# Patient Record
Sex: Female | Born: 1935 | Race: White | Hispanic: No | Smoking: Never smoker
Health system: Southern US, Community
[De-identification: ages and names within clinical notes are randomized; demographics above are authoritative.]

## PROBLEM LIST (undated history)

## (undated) DIAGNOSIS — C801 Malignant (primary) neoplasm, unspecified: Secondary | ICD-10-CM

## (undated) DIAGNOSIS — I10 Essential (primary) hypertension: Secondary | ICD-10-CM

## (undated) DIAGNOSIS — E079 Disorder of thyroid, unspecified: Secondary | ICD-10-CM

## (undated) DIAGNOSIS — M1990 Unspecified osteoarthritis, unspecified site: Secondary | ICD-10-CM

## (undated) DIAGNOSIS — N289 Disorder of kidney and ureter, unspecified: Secondary | ICD-10-CM

## (undated) DIAGNOSIS — M109 Gout, unspecified: Secondary | ICD-10-CM

## (undated) HISTORY — PX: ABDOMINAL HYSTERECTOMY: SHX81

## (undated) HISTORY — PX: THYROIDECTOMY: SHX17

## (undated) HISTORY — PX: CHOLECYSTECTOMY: SHX55

---

## 2002-11-18 ENCOUNTER — Ambulatory Visit (HOSPITAL_COMMUNITY): Admission: RE | Admit: 2002-11-18 | Discharge: 2002-11-18 | Payer: Self-pay | Admitting: Family Medicine

## 2009-02-24 ENCOUNTER — Emergency Department (HOSPITAL_COMMUNITY): Admission: EM | Admit: 2009-02-24 | Discharge: 2009-02-24 | Payer: Self-pay | Admitting: Emergency Medicine

## 2009-03-27 ENCOUNTER — Emergency Department (HOSPITAL_COMMUNITY): Admission: EM | Admit: 2009-03-27 | Discharge: 2009-03-27 | Payer: Self-pay | Admitting: Emergency Medicine

## 2009-04-20 ENCOUNTER — Emergency Department (HOSPITAL_COMMUNITY)
Admission: EM | Admit: 2009-04-20 | Discharge: 2009-04-20 | Payer: Self-pay | Source: Home / Self Care | Admitting: Emergency Medicine

## 2009-05-07 ENCOUNTER — Ambulatory Visit (HOSPITAL_COMMUNITY): Admission: RE | Admit: 2009-05-07 | Discharge: 2009-05-07 | Payer: Self-pay | Admitting: Family Medicine

## 2009-07-16 ENCOUNTER — Encounter (INDEPENDENT_AMBULATORY_CARE_PROVIDER_SITE_OTHER): Payer: Self-pay | Admitting: General Surgery

## 2009-07-16 ENCOUNTER — Ambulatory Visit (HOSPITAL_COMMUNITY)
Admission: RE | Admit: 2009-07-16 | Discharge: 2009-07-17 | Payer: Self-pay | Source: Home / Self Care | Admitting: General Surgery

## 2010-01-23 ENCOUNTER — Encounter: Payer: Self-pay | Admitting: General Surgery

## 2010-03-19 LAB — POCT I-STAT 4, (NA,K, GLUC, HGB,HCT)
Glucose, Bld: 133 mg/dL — ABNORMAL HIGH (ref 70–99)
HCT: 36 % (ref 36.0–46.0)
Hemoglobin: 12.2 g/dL (ref 12.0–15.0)
Potassium: 3.6 mEq/L (ref 3.5–5.1)
Sodium: 141 mEq/L (ref 135–145)

## 2010-03-19 LAB — CBC
HCT: 28.8 % — ABNORMAL LOW (ref 36.0–46.0)
Hemoglobin: 9.9 g/dL — ABNORMAL LOW (ref 12.0–15.0)
MCH: 30.5 pg (ref 26.0–34.0)
MCHC: 34.2 g/dL (ref 30.0–36.0)
MCV: 89.4 fL (ref 78.0–100.0)
Platelets: 145 10*3/uL — ABNORMAL LOW (ref 150–400)
RBC: 3.23 MIL/uL — ABNORMAL LOW (ref 3.87–5.11)
RDW: 12.3 % (ref 11.5–15.5)
WBC: 5 10*3/uL (ref 4.0–10.5)

## 2010-03-19 LAB — BASIC METABOLIC PANEL
BUN: 20 mg/dL (ref 6–23)
CO2: 28 mEq/L (ref 19–32)
Calcium: 8.9 mg/dL (ref 8.4–10.5)
Chloride: 108 mEq/L (ref 96–112)
Creatinine, Ser: 1.31 mg/dL — ABNORMAL HIGH (ref 0.4–1.2)
GFR calc Af Amer: 48 mL/min — ABNORMAL LOW (ref >60)
GFR calc non Af Amer: 40 mL/min — ABNORMAL LOW (ref >60)
Glucose, Bld: 108 mg/dL — ABNORMAL HIGH (ref 70–99)
Potassium: 3.4 mEq/L — ABNORMAL LOW (ref 3.5–5.1)
Sodium: 141 mEq/L (ref 135–145)

## 2010-03-19 LAB — DIFFERENTIAL
Basophils Absolute: 0 10*3/uL (ref 0.0–0.1)
Basophils Relative: 1 % (ref 0–1)
Eosinophils Absolute: 0 10*3/uL (ref 0.0–0.7)
Eosinophils Relative: 1 % (ref 0–5)
Lymphocytes Relative: 34 % (ref 12–46)
Lymphs Abs: 1.7 10*3/uL (ref 0.7–4.0)
Monocytes Absolute: 0.5 10*3/uL (ref 0.1–1.0)
Monocytes Relative: 10 % (ref 3–12)
Neutro Abs: 2.8 10*3/uL (ref 1.7–7.7)
Neutrophils Relative %: 55 % (ref 43–77)

## 2010-03-19 LAB — HEPATIC FUNCTION PANEL
ALT: 127 U/L — ABNORMAL HIGH (ref 0–35)
AST: 126 U/L — ABNORMAL HIGH (ref 0–37)
Albumin: 3.2 g/dL — ABNORMAL LOW (ref 3.5–5.2)
Alkaline Phosphatase: 116 U/L (ref 39–117)
Bilirubin, Direct: <0.1 mg/dL (ref 0.0–0.3)
Total Bilirubin: 0.7 mg/dL (ref 0.3–1.2)
Total Protein: 5.8 g/dL — ABNORMAL LOW (ref 6.0–8.3)

## 2010-03-20 LAB — HEPATIC FUNCTION PANEL
ALT: 13 U/L (ref 0–35)
AST: 17 U/L (ref 0–37)
Albumin: 4 g/dL (ref 3.5–5.2)
Alkaline Phosphatase: 88 U/L (ref 39–117)
Bilirubin, Direct: 0.1 mg/dL (ref 0.0–0.3)
Indirect Bilirubin: 0.4 mg/dL (ref 0.3–0.9)
Total Bilirubin: 0.5 mg/dL (ref 0.3–1.2)
Total Protein: 6.9 g/dL (ref 6.0–8.3)

## 2010-03-20 LAB — LIPASE, BLOOD: Lipase: 26 U/L (ref 11–59)

## 2010-03-20 LAB — DIFFERENTIAL
Basophils Absolute: 0 10*3/uL (ref 0.0–0.1)
Basophils Relative: 1 % (ref 0–1)
Eosinophils Absolute: 0.1 10*3/uL (ref 0.0–0.7)
Eosinophils Relative: 2 % (ref 0–5)
Lymphocytes Relative: 50 % — ABNORMAL HIGH (ref 12–46)
Lymphs Abs: 2.9 10*3/uL (ref 0.7–4.0)
Monocytes Absolute: 0.3 10*3/uL (ref 0.1–1.0)
Monocytes Relative: 6 % (ref 3–12)
Neutro Abs: 2.4 10*3/uL (ref 1.7–7.7)
Neutrophils Relative %: 42 % — ABNORMAL LOW (ref 43–77)

## 2010-03-20 LAB — CBC
HCT: 34.6 % — ABNORMAL LOW (ref 36.0–46.0)
Hemoglobin: 11.8 g/dL — ABNORMAL LOW (ref 12.0–15.0)
MCH: 30.8 pg (ref 26.0–34.0)
MCHC: 34.2 g/dL (ref 30.0–36.0)
MCV: 89.9 fL (ref 78.0–100.0)
Platelets: 180 10*3/uL (ref 150–400)
RBC: 3.85 MIL/uL — ABNORMAL LOW (ref 3.87–5.11)
RDW: 12.4 % (ref 11.5–15.5)
WBC: 5.8 10*3/uL (ref 4.0–10.5)

## 2010-03-20 LAB — BASIC METABOLIC PANEL
BUN: 41 mg/dL — ABNORMAL HIGH (ref 6–23)
CO2: 26 mEq/L (ref 19–32)
Calcium: 9.7 mg/dL (ref 8.4–10.5)
Chloride: 107 mEq/L (ref 96–112)
Creatinine, Ser: 1.68 mg/dL — ABNORMAL HIGH (ref 0.4–1.2)
GFR calc Af Amer: 36 mL/min — ABNORMAL LOW (ref >60)
GFR calc non Af Amer: 30 mL/min — ABNORMAL LOW (ref >60)
Glucose, Bld: 119 mg/dL — ABNORMAL HIGH (ref 70–99)
Potassium: 5.5 mEq/L — ABNORMAL HIGH (ref 3.5–5.1)
Sodium: 141 mEq/L (ref 135–145)

## 2010-03-20 LAB — AMYLASE: Amylase: 32 U/L (ref 0–105)

## 2010-03-20 LAB — SURGICAL PCR SCREEN
MRSA, PCR: NEGATIVE
Staphylococcus aureus: POSITIVE — AB

## 2010-03-20 LAB — TSH: TSH: 0.28 u[IU]/mL — ABNORMAL LOW (ref 0.350–4.500)

## 2010-03-20 LAB — T3: T3, Total: 83.5 ng/dl (ref 80.0–204.0)

## 2010-03-20 LAB — T4: T4, Total: 11.8 ug/dL (ref 5.0–12.5)

## 2010-03-22 LAB — CBC
HCT: 32.1 % — ABNORMAL LOW (ref 36.0–46.0)
Hemoglobin: 11.4 g/dL — ABNORMAL LOW (ref 12.0–15.0)
MCHC: 35.4 g/dL (ref 30.0–36.0)
MCV: 89 fL (ref 78.0–100.0)
Platelets: 203 10*3/uL (ref 150–400)
RBC: 3.61 MIL/uL — ABNORMAL LOW (ref 3.87–5.11)
RDW: 12.7 % (ref 11.5–15.5)
WBC: 8.1 10*3/uL (ref 4.0–10.5)

## 2010-03-22 LAB — DIFFERENTIAL
Basophils Absolute: 0 10*3/uL (ref 0.0–0.1)
Basophils Relative: 0 % (ref 0–1)
Eosinophils Absolute: 0 10*3/uL (ref 0.0–0.7)
Eosinophils Relative: 0 % (ref 0–5)
Lymphocytes Relative: 17 % (ref 12–46)
Lymphs Abs: 1.3 10*3/uL (ref 0.7–4.0)
Monocytes Absolute: 0.4 10*3/uL (ref 0.1–1.0)
Monocytes Relative: 5 % (ref 3–12)
Neutro Abs: 6.4 10*3/uL (ref 1.7–7.7)
Neutrophils Relative %: 78 % — ABNORMAL HIGH (ref 43–77)

## 2010-03-22 LAB — POCT CARDIAC MARKERS
CKMB, poc: <1 ng/mL — ABNORMAL LOW (ref 1.0–8.0)
Myoglobin, poc: 44.2 ng/mL (ref 12–200)
Myoglobin, poc: 97.3 ng/mL (ref 12–200)
Troponin i, poc: <0.05 ng/mL (ref 0.00–0.09)

## 2010-03-22 LAB — COMPREHENSIVE METABOLIC PANEL
ALT: 41 U/L — ABNORMAL HIGH (ref 0–35)
AST: 61 U/L — ABNORMAL HIGH (ref 0–37)
Albumin: 3.5 g/dL (ref 3.5–5.2)
Alkaline Phosphatase: 132 U/L — ABNORMAL HIGH (ref 39–117)
BUN: 38 mg/dL — ABNORMAL HIGH (ref 6–23)
CO2: 27 mEq/L (ref 19–32)
Calcium: 9.2 mg/dL (ref 8.4–10.5)
Chloride: 106 mEq/L (ref 96–112)
Creatinine, Ser: 1.46 mg/dL — ABNORMAL HIGH (ref 0.4–1.2)
GFR calc Af Amer: 43 mL/min — ABNORMAL LOW (ref >60)
GFR calc non Af Amer: 35 mL/min — ABNORMAL LOW (ref >60)
Glucose, Bld: 153 mg/dL — ABNORMAL HIGH (ref 70–99)
Potassium: 3.9 mEq/L (ref 3.5–5.1)
Sodium: 142 mEq/L (ref 135–145)
Total Bilirubin: 0.5 mg/dL (ref 0.3–1.2)
Total Protein: 6.7 g/dL (ref 6.0–8.3)

## 2011-06-30 ENCOUNTER — Ambulatory Visit (HOSPITAL_COMMUNITY)
Admission: RE | Admit: 2011-06-30 | Discharge: 2011-06-30 | Disposition: A | Discharge status: Home / Self Care | Payer: Medicare Other | Source: Ambulatory Visit | Attending: Nephrology | Admitting: Nephrology

## 2011-06-30 DIAGNOSIS — I10 Essential (primary) hypertension: Secondary | ICD-10-CM | POA: Insufficient documentation

## 2011-06-30 DIAGNOSIS — R609 Edema, unspecified: Secondary | ICD-10-CM | POA: Insufficient documentation

## 2011-06-30 DIAGNOSIS — I517 Cardiomegaly: Secondary | ICD-10-CM

## 2011-06-30 NOTE — Progress Notes (Signed)
*  PRELIMINARY RESULTS* Echocardiogram 2D Echocardiogram has been performed.  Amber Terry 06/30/2011, 10:06 AM

## 2011-09-28 ENCOUNTER — Other Ambulatory Visit (HOSPITAL_COMMUNITY): Payer: Self-pay | Admitting: Internal Medicine

## 2011-09-28 DIAGNOSIS — Z139 Encounter for screening, unspecified: Secondary | ICD-10-CM

## 2011-10-02 ENCOUNTER — Ambulatory Visit (HOSPITAL_COMMUNITY)
Admission: RE | Admit: 2011-10-02 | Discharge: 2011-10-02 | Disposition: A | Discharge status: Home / Self Care | Payer: Medicare Other | Source: Ambulatory Visit | Attending: Internal Medicine | Admitting: Internal Medicine

## 2011-10-02 DIAGNOSIS — Z139 Encounter for screening, unspecified: Secondary | ICD-10-CM

## 2011-10-02 DIAGNOSIS — M899 Disorder of bone, unspecified: Secondary | ICD-10-CM | POA: Insufficient documentation

## 2011-10-02 DIAGNOSIS — Z1231 Encounter for screening mammogram for malignant neoplasm of breast: Secondary | ICD-10-CM | POA: Insufficient documentation

## 2011-10-02 DIAGNOSIS — M949 Disorder of cartilage, unspecified: Secondary | ICD-10-CM | POA: Insufficient documentation

## 2011-10-04 ENCOUNTER — Ambulatory Visit: Payer: Medicare Other | Admitting: Cardiology

## 2011-10-24 ENCOUNTER — Ambulatory Visit: Payer: Medicare Other | Admitting: Cardiology

## 2011-10-27 ENCOUNTER — Ambulatory Visit: Payer: Medicare Other | Admitting: Cardiology

## 2014-01-13 ENCOUNTER — Emergency Department (HOSPITAL_COMMUNITY)
Admission: EM | Admit: 2014-01-13 | Discharge: 2014-01-13 | Disposition: A | Discharge status: Home / Self Care | Payer: Medicare Other | Attending: Emergency Medicine | Admitting: Emergency Medicine

## 2014-01-13 ENCOUNTER — Ambulatory Visit (HOSPITAL_COMMUNITY)
Admission: RE | Admit: 2014-01-13 | Discharge: 2014-01-13 | Disposition: A | Discharge status: Home / Self Care | Payer: Medicare Other | Source: Ambulatory Visit | Attending: Family Medicine | Admitting: Family Medicine

## 2014-01-13 ENCOUNTER — Other Ambulatory Visit (HOSPITAL_COMMUNITY): Payer: Self-pay | Admitting: Family Medicine

## 2014-01-13 ENCOUNTER — Encounter (HOSPITAL_COMMUNITY): Payer: Self-pay | Admitting: Cardiology

## 2014-01-13 ENCOUNTER — Emergency Department (HOSPITAL_COMMUNITY): Payer: Medicare Other

## 2014-01-13 DIAGNOSIS — I10 Essential (primary) hypertension: Secondary | ICD-10-CM | POA: Insufficient documentation

## 2014-01-13 DIAGNOSIS — M25571 Pain in right ankle and joints of right foot: Secondary | ICD-10-CM | POA: Diagnosis not present

## 2014-01-13 DIAGNOSIS — Z87448 Personal history of other diseases of urinary system: Secondary | ICD-10-CM | POA: Diagnosis not present

## 2014-01-13 DIAGNOSIS — M25474 Effusion, right foot: Secondary | ICD-10-CM

## 2014-01-13 DIAGNOSIS — Z8639 Personal history of other endocrine, nutritional and metabolic disease: Secondary | ICD-10-CM | POA: Diagnosis not present

## 2014-01-13 DIAGNOSIS — Z859 Personal history of malignant neoplasm, unspecified: Secondary | ICD-10-CM | POA: Insufficient documentation

## 2014-01-13 DIAGNOSIS — M79671 Pain in right foot: Secondary | ICD-10-CM

## 2014-01-13 DIAGNOSIS — R2241 Localized swelling, mass and lump, right lower limb: Secondary | ICD-10-CM | POA: Diagnosis not present

## 2014-01-13 HISTORY — DX: Disorder of thyroid, unspecified: E07.9

## 2014-01-13 HISTORY — DX: Malignant (primary) neoplasm, unspecified: C80.1

## 2014-01-13 HISTORY — DX: Essential (primary) hypertension: I10

## 2014-01-13 HISTORY — DX: Unspecified osteoarthritis, unspecified site: M19.90

## 2014-01-13 HISTORY — DX: Disorder of kidney and ureter, unspecified: N28.9

## 2014-01-13 LAB — CBC WITH DIFFERENTIAL/PLATELET
Basophils Absolute: 0 10*3/uL (ref 0.0–0.1)
Basophils Relative: 0 % (ref 0–1)
EOS ABS: 0 10*3/uL (ref 0.0–0.7)
EOS PCT: 0 % (ref 0–5)
HEMATOCRIT: 40.7 % (ref 36.0–46.0)
HEMOGLOBIN: 13 g/dL (ref 12.0–15.0)
LYMPHS ABS: 3.5 10*3/uL (ref 0.7–4.0)
LYMPHS PCT: 40 % (ref 12–46)
MCH: 29.8 pg (ref 26.0–34.0)
MCHC: 31.9 g/dL (ref 30.0–36.0)
MCV: 93.3 fL (ref 78.0–100.0)
MONOS PCT: 6 % (ref 3–12)
Monocytes Absolute: 0.5 10*3/uL (ref 0.1–1.0)
Neutro Abs: 4.8 10*3/uL (ref 1.7–7.7)
Neutrophils Relative %: 54 % (ref 43–77)
Platelets: 284 10*3/uL (ref 150–400)
RBC: 4.36 MIL/uL (ref 3.87–5.11)
RDW: 12.5 % (ref 11.5–15.5)
WBC: 8.8 10*3/uL (ref 4.0–10.5)

## 2014-01-13 LAB — BASIC METABOLIC PANEL
Anion gap: 11 (ref 5–15)
BUN: 23 mg/dL (ref 6–23)
CALCIUM: 9.3 mg/dL (ref 8.4–10.5)
CO2: 28 mmol/L (ref 19–32)
CREATININE: 1.06 mg/dL (ref 0.50–1.10)
Chloride: 102 mEq/L (ref 96–112)
GFR, EST AFRICAN AMERICAN: 58 mL/min — AB (ref >90)
GFR, EST NON AFRICAN AMERICAN: 50 mL/min — AB (ref >90)
Glucose, Bld: 151 mg/dL — ABNORMAL HIGH (ref 70–99)
POTASSIUM: 3.6 mmol/L (ref 3.5–5.1)
Sodium: 141 mmol/L (ref 135–145)

## 2014-01-13 LAB — SEDIMENTATION RATE: Sed Rate: 87 mm/hr — ABNORMAL HIGH (ref 0–22)

## 2014-01-13 MED ORDER — MORPHINE SULFATE 4 MG/ML IJ SOLN: 4.0000 mg | Freq: Once | INTRAMUSCULAR | Status: DC

## 2014-01-13 MED ORDER — HYDROMORPHONE HCL 1 MG/ML IJ SOLN
0.5000 mg | Freq: Once | INTRAMUSCULAR | Status: AC
  Administered 2014-01-13: 0.5 mg via INTRAVENOUS
  Filled 2014-01-13: qty 1

## 2014-01-13 MED ORDER — HYDROCODONE-ACETAMINOPHEN 5-325 MG PO TABS: 1.0000 | ORAL_TABLET | ORAL | Status: DC | PRN

## 2014-01-13 NOTE — ED Notes (Signed)
C/o right foot arthritis.  Went to her PCP this morning and they sent her here.  States they could not give her a "shot" because her kidneys are bad.

## 2014-01-13 NOTE — ED Provider Notes (Signed)
CSN: [id number]     Arrival date & time 01/13/14  1123 History   This chart was scribed for Mariea Clonts, MD by Chester Holstein, ED Scribe. This patient was seen in room APA06/APA06 and the patient's care was started at 1:28 PM.    Chief Complaint  Patient presents with  . Foot Pain    The history is provided by the patient. No language interpreter was used.    HPI Comments: Amber Terry is a 79 y.o. female with PMHx of HTN, Ca, gout, and renal disorder who presents to the Emergency Department complaining of right foot pain with onset 2 weeks ago.  Pt notes difficulty ambulating. Pt notes associated right hip pain because of this. She was seen by her PCP this morning and told pain was arthritic.  Pt notes an X-Ray was done. Pt was told she could not get an injection due to her renal PMHx. Pt takes prednisone. She denies PMHx of bone infection and DM. Pt denies any known injury. Pt's PCP is Dr. Hilma Favors.   Past Medical History  Diagnosis Date  . Renal disorder   . Hypertension   . Thyroid disease   . Arthritis   . Cancer    Past Surgical History  Procedure Laterality Date  . Thyroidectomy    . Abdominal hysterectomy    . Cholecystectomy     History reviewed. No pertinent family history. History  Substance Use Topics  . Smoking status: Never Smoker   . Smokeless tobacco: Not on file  . Alcohol Use: No   OB History    No data available     Review of Systems  Musculoskeletal: Positive for myalgias, joint swelling and arthralgias.  All other systems reviewed and are negative.     Allergies  Ampicillin  Home Medications   Prior to Admission medications   Not on File   BP 167/77 mmHg  Pulse 73  Temp(Src) 97.8 F (36.6 C) (Oral)  Resp 18  Ht 5\' 4"  (1.626 m)  Wt 230 lb (104.327 kg)  BMI 39.46 kg/m2  SpO2 99% Physical Exam  Constitutional: She is oriented to person, place, and time. She appears well-developed and well-nourished.  HENT:  Head: Normocephalic.   Eyes: Conjunctivae are normal.  Neck: Normal range of motion. Neck supple.  Cardiovascular: Intact distal pulses.   Good cap refill  Distal pulses 2+  Pulmonary/Chest: Effort normal.  Abdominal: Soft. There is no tenderness.  Musculoskeletal: Normal range of motion.       Right foot: There is tenderness (to mid dorsal aspect) and swelling (mild to moderate on mid dorsal). There is no crepitus.  Mid dorsal foot: no erythema, no induration Pain with plantar and dorsiflexion  Neurological: She is alert and oriented to person, place, and time.  Skin: Skin is warm and dry.  Psychiatric: She has a normal mood and affect. Her behavior is normal.  Nursing note and vitals reviewed.   ED Course  Procedures (including critical care time) DIAGNOSTIC STUDIES: Oxygen Saturation is 99% on room air, normal by my interpretation.    COORDINATION OF CARE: 1:38 PM Discussed treatment plan with patient at beside, the patient agrees with the plan and has no further questions at this time.   Labs Review Labs Reviewed  SEDIMENTATION RATE  CBC WITH DIFFERENTIAL  BASIC METABOLIC PANEL    Imaging Review Dg Ankle Complete Right  01/13/2014   CLINICAL DATA:  Entire right ankle pain for 2 weeks. No known injury.  History of gout.  EXAM: RIGHT ANKLE - COMPLETE 3+ VIEW  COMPARISON:  Foot images 223 2011  FINDINGS: Diffuse soft tissue swelling. No underlying acute bony abnormality. No fracture, subluxation or dislocation.  IMPRESSION: No acute bony abnormality.  Diffuse soft tissue swelling.   Electronically Signed   By: Rolm Baptise M.D.   On: 01/13/2014 10:55     EKG Interpretation None      MDM   Final diagnoses:  Acute foot pain, right   Patient with worsening right foot pain, history of osteoarthritis and possible gout she is unsure. Patient's pain improved significantly and recheck. X-ray reviewed no acute fracture, swelling seen.  Blood work reviewed, sedimentation rate pending and patient  will follow-up outpatient for sedimentation rate result as significant delay.  Results and differential diagnosis were discussed with the patient/parent/guardian. Close follow up outpatient was discussed, comfortable with the plan.   Medications  HYDROmorphone (DILAUDID) injection 0.5 mg (0.5 mg Intravenous Given 01/13/14 1355)    Filed Vitals:   01/13/14 1130 01/13/14 1440  BP: 167/77 163/72  Pulse: 73 69  Temp: 97.8 F (36.6 C)   TempSrc: Oral   Resp: 18 16  Height: 5\' 4"  (1.626 m)   Weight: 230 lb (104.327 kg)   SpO2: 99% 100%    Final diagnoses:  Acute foot pain, right      Mariea Clonts, MD 01/13/14 1609

## 2014-01-13 NOTE — Discharge Instructions (Signed)
If you were given medicines take as directed.  If you are on coumadin or contraceptives realize their levels and effectiveness is altered by many different medicines.  If you have any reaction (rash, tongues swelling, other) to the medicines stop taking and see a physician.   Please follow up as directed and return to the ER or see a physician for new or worsening symptoms such as spreading redness, fever, other.  Thank you. Filed Vitals:   01/13/14 1130 01/13/14 1440  BP: 167/77 163/72  Pulse: 73 69  Temp: 97.8 F (36.6 C)   TempSrc: Oral   Resp: 18 16  Height: 5\' 4"  (1.626 m)   Weight: 230 lb (104.327 kg)   SpO2: 99% 100%

## 2014-02-05 ENCOUNTER — Encounter: Payer: Self-pay | Admitting: Orthopedic Surgery

## 2014-02-05 ENCOUNTER — Ambulatory Visit (INDEPENDENT_AMBULATORY_CARE_PROVIDER_SITE_OTHER): Payer: Medicare Other | Admitting: Orthopedic Surgery

## 2014-02-05 VITALS — BP 184/95 | Ht 64.0 in | Wt 230.0 lb

## 2014-02-05 DIAGNOSIS — M13871 Other specified arthritis, right ankle and foot: Secondary | ICD-10-CM

## 2014-02-05 DIAGNOSIS — M19071 Primary osteoarthritis, right ankle and foot: Secondary | ICD-10-CM

## 2014-02-05 MED ORDER — PREDNISONE (PAK) 5 MG PO TABS: ORAL_TABLET | ORAL | Status: DC

## 2014-02-05 NOTE — Progress Notes (Signed)
Patient ID: TEMESHA QUEENER, female   DOB: [birth year], 79 y.o.   MRN: [id number]  Chief Complaint  Patient presents with  . Follow-up    er follow up Right ankle pain, REF GOLDING    HPI HARMONY SANDELL is a 79 y.o. female.  Presents for evaluation of her right ankle. She is age medical onset of pain swelling locking stiffness giving way which required an ER visit. Her pain became 10 out of 10 but she was treated with hydrocodone and a steroid Dosepak and has improved and now presents with just tenderness in the foot her gait has returned to normal with a cane HPI  Review of Systems Review of Systems Positive includes hearing loss vision problems ankle leg swelling from chronic venous stasis disease, constipation and diarrhea. Loss of bladder control. Redness of the skin related to the venous stasis disease. Weakness of her limbs muscle weakness gait disturbance stiff joints. Remaining review of systems negative    Past Medical History  Diagnosis Date  . Renal disorder   . Hypertension   . Thyroid disease   . Arthritis   . Cancer     Past Surgical History  Procedure Laterality Date  . Thyroidectomy    . Abdominal hysterectomy    . Cholecystectomy      No family history on file.  Social History History  Substance Use Topics  . Smoking status: Never Smoker   . Smokeless tobacco: Not on file  . Alcohol Use: No    Allergies  Allergen Reactions  . Ampicillin Rash    Current Outpatient Prescriptions  Medication Sig Dispense Refill  . amLODipine (NORVASC) 10 MG tablet Take 10 mg by mouth daily.  3  . furosemide (LASIX) 20 MG tablet Take 20 mg by mouth daily as needed.  1  . HYDROcodone-acetaminophen (NORCO) 5-325 MG per tablet Take 1-2 tablets by mouth every 4 (four) hours as needed. 10 tablet 0  . HYDROcodone-acetaminophen (NORCO/VICODIN) 5-325 MG per tablet Take 1 tablet by mouth 4 (four) times daily as needed.  0  . levothyroxine (SYNTHROID, LEVOTHROID) 100 MCG tablet Take  100 mcg by mouth daily.  0  . predniSONE (DELTASONE) 5 MG tablet Take 5 mg by mouth as directed. 12 day pak  0  . predniSONE (STERAPRED UNI-PAK) 5 MG TABS tablet Use as directed 48 tablet 0   No current facility-administered medications for this visit.       Physical Exam Blood pressure 184/95, height 5\' 4"  (1.626 m), weight 230 lb (104.327 kg). Physical Exam Normal development grooming and hygiene with obesity BP 184/95 mmHg  Ht 5\' 4"  (1.626 m)  Wt 230 lb (104.327 kg)  BMI 39.46 kg/m2 Mood and affect normal gait supported by a cane. She has a slow gait. She favors the right leg.  Her ankle is nontender but she has tenderness in her foot the skin over both leg show venous stasis disease she has mild peripheral edema bilaterally motor exam of the ankle joint is normal although the range of motion in the joint is limited. The ankle joint however remain stable muscle strength and tone are normal she has normal dorsalis pedis pulse peripheral edema related to venous stasis sensation remains intact she has no lymphadenopathy  Data Reviewed X-ray shows osteopenia without fracture  Assessment    Inflammation of the right ankle    Plan    Normal activities use prednisone Dosepak 5 mg 12 days if symptoms recur but currently asymptomatic follow-up  as needed

## 2014-02-05 NOTE — Patient Instructions (Signed)
FILL SCRIPT IF PAIN IN ANKLE COMES BACK

## 2014-05-18 ENCOUNTER — Other Ambulatory Visit: Payer: Self-pay | Admitting: Orthopedic Surgery

## 2014-05-22 ENCOUNTER — Other Ambulatory Visit: Payer: Self-pay | Admitting: *Deleted

## 2014-05-22 MED ORDER — PREDNISONE 5 MG (21) PO TBPK: 5.0000 mg | ORAL_TABLET | Freq: Every day | ORAL | Status: DC

## 2015-01-26 ENCOUNTER — Other Ambulatory Visit: Payer: Self-pay | Admitting: Orthopedic Surgery

## 2015-01-26 ENCOUNTER — Telehealth: Payer: Self-pay | Admitting: *Deleted

## 2015-01-26 NOTE — Telephone Encounter (Signed)
refill 

## 2015-01-26 NOTE — Telephone Encounter (Signed)
Patient called requesting prednisone to be refilled, patient uses [facility], I also made the patient aware to call [facility] to have them send over the prescription refill request. Please advise

## 2015-01-26 NOTE — Telephone Encounter (Signed)
Sent to pharmacy as requested 

## 2015-01-26 NOTE — Telephone Encounter (Signed)
Last prescribed in May

## 2015-01-27 NOTE — Telephone Encounter (Signed)
Patient aware.

## 2015-03-01 ENCOUNTER — Telehealth: Payer: Self-pay | Admitting: *Deleted

## 2015-03-01 ENCOUNTER — Other Ambulatory Visit: Payer: Self-pay | Admitting: *Deleted

## 2015-03-01 MED ORDER — PREDNISONE 5 MG (48) PO TBPK: ORAL_TABLET | ORAL | Status: DC

## 2015-03-01 NOTE — Telephone Encounter (Signed)
ROUTING TO DR HARRISON FOR APPROVAL 

## 2015-03-01 NOTE — Telephone Encounter (Signed)
Patient's daughter Vaughan Basta called requesting that hydrocodone to be refilled. Per Vaughan Basta patient states her right foot is hurting and she needs something for the pain. Please advise [phone]

## 2015-03-01 NOTE — Telephone Encounter (Signed)
Prednisone yes  no opioids

## 2015-03-01 NOTE — Telephone Encounter (Signed)
Prednisone sent to pharmacy

## 2015-03-01 NOTE — Telephone Encounter (Signed)
Patient also requests a refill on Prednisone.

## 2015-03-03 ENCOUNTER — Other Ambulatory Visit: Payer: Self-pay | Admitting: *Deleted

## 2015-03-03 MED ORDER — PREDNISONE 5 MG (48) PO TBPK: ORAL_TABLET | ORAL | Status: DC

## 2015-03-03 DEATH — deceased

## 2015-04-14 ENCOUNTER — Ambulatory Visit (INDEPENDENT_AMBULATORY_CARE_PROVIDER_SITE_OTHER): Payer: Medicare Other

## 2015-04-14 ENCOUNTER — Encounter: Payer: Self-pay | Admitting: Orthopaedic Surgery

## 2015-04-14 ENCOUNTER — Ambulatory Visit (INDEPENDENT_AMBULATORY_CARE_PROVIDER_SITE_OTHER): Payer: Medicare Other | Admitting: Orthopaedic Surgery

## 2015-04-14 VITALS — BP 171/78 | HR 96 | Temp 97.0°F | Resp 16 | Ht 64.0 in | Wt 215.0 lb

## 2015-04-14 DIAGNOSIS — M79672 Pain in left foot: Secondary | ICD-10-CM | POA: Diagnosis not present

## 2015-04-14 DIAGNOSIS — I70209 Unspecified atherosclerosis of native arteries of extremities, unspecified extremity: Secondary | ICD-10-CM | POA: Diagnosis not present

## 2015-04-14 DIAGNOSIS — M25572 Pain in left ankle and joints of left foot: Secondary | ICD-10-CM

## 2015-04-14 DIAGNOSIS — R600 Localized edema: Secondary | ICD-10-CM | POA: Diagnosis not present

## 2015-04-14 MED ORDER — TRAMADOL HCL 50 MG PO TABS: 50.0000 mg | ORAL_TABLET | Freq: Four times a day (QID) | ORAL | Status: DC | PRN

## 2015-04-14 NOTE — Patient Instructions (Signed)
See Family Doctor for circulation problem Check your feet twice a day and look at the bottom every morning and night

## 2015-04-14 NOTE — Progress Notes (Signed)
Subjective: my left foot and ankle hurt    Patient ID: Amber Terry, female    DOB: May 03, 1935, 80 y.o.   MRN: [id number]  Ankle Pain  There was no injury mechanism. The pain is present in the left ankle, left foot and left toes. The quality of the pain is described as aching. The pain is at a severity of 4/10. The pain is moderate. The pain has been fluctuating since onset. Associated symptoms include a loss of motion, a loss of sensation, numbness and tingling. The symptoms are aggravated by weight bearing. She has tried elevation, non-weight bearing and rest for the symptoms. The treatment provided mild relief.   She has had swelling and pain of the left ankle and left foot.  She has swelling of the right lower leg but it does not hurt like the left side.  She has pain more at night and has numbness, tingling feeling.  She has no trauma.  She has no redness.  She has had this for some time   She has discoloration of both lower legs and has been told she has "circulatoin problems."  She is here today to make sure she has no fracture or bony problem.  She does not have diabetes she says.  She has hypertension that is well controlled.  She has no smoking history.  She uses a walker regularly.   Review of Systems  HENT: Positive for congestion and hearing loss.   Respiratory: Negative for cough and shortness of breath.   Cardiovascular: Positive for leg swelling. Negative for chest pain.  Endocrine: Positive for cold intolerance.  Musculoskeletal: Positive for myalgias, joint swelling, arthralgias and gait problem.  Skin: Positive for color change.  Allergic/Immunologic: Positive for environmental allergies.  Neurological: Positive for tingling, weakness and numbness.   Past Medical History  Diagnosis Date  . Renal disorder   . Hypertension   . Thyroid disease   . Arthritis   . Cancer [facility])    Past Surgical History  Procedure Laterality Date  . Thyroidectomy    . Abdominal  hysterectomy    . Cholecystectomy     Social History   Social History  . Marital Status: Widowed    Spouse Name: N/A  . Number of Children: N/A  . Years of Education: N/A   Occupational History  . Not on file.   Social History Main Topics  . Smoking status: Never Smoker   . Smokeless tobacco: Not on file  . Alcohol Use: No  . Drug Use: No  . Sexual Activity: Not on file   Other Topics Concern  . Not on file   Social History Narrative   BP 171/78 mmHg  Pulse 96  Temp(Src) 97 F (36.1 C)  Resp 16  Ht 5\' 4"  (1.626 m)  Wt 215 lb (97.523 kg)  BMI 36.89 kg/m2     Objective:   Physical Exam  Constitutional: She is oriented to person, place, and time. She appears well-developed and well-nourished.  HENT:  Head: Normocephalic and atraumatic.  Very hard of hearing  Eyes: Conjunctivae and EOM are normal. Pupils are equal, round, and reactive to light.  Neck: Normal range of motion. Neck supple.  Cardiovascular: Normal rate, regular rhythm and intact distal pulses.   Pulmonary/Chest: Effort normal.  Abdominal: Soft.  Musculoskeletal: She exhibits tenderness (She has swelling of both feet, decreased pulses, decreased sensatoin of both feet with no erythema.  She uses a walker.).  Neurological: She is alert and  oriented to person, place, and time. She displays normal reflexes. No cranial nerve deficit. She exhibits normal muscle tone. Coordination normal.  Skin: Skin is warm and dry.  She has brawny edema of both lower legs and feet and ankles.   Psychiatric: She has a normal mood and affect. Her behavior is normal. Judgment and thought content normal.   Right Ankle Exam  Swelling: moderate  Range of Motion  Dorsiflexion: 10  Plantar flexion: 10  Inversion: 5  Eversion: 5   Muscle Strength  The patient has normal right ankle strength.  Tests  Anterior drawer: negative Other  Sensation: decreased Right ankle pulse absent: very weak.    Left Ankle Exam   Swelling: moderate  Range of Motion  Dorsiflexion: 10  Plantar flexion: 10  Inversion: 5  Eversion: 5   Muscle Strength  The patient has normal left ankle strength.  Tests  Anterior drawer: negative  Other  Sensation: decreased Left ankle pulse absent: very weak.        Assessment & Plan:   Encounter Diagnoses  Name Primary?  . Left foot pain Yes  . Left ankle pain   . Atherosclerotic peripheral vascular disease ([place])   . Edema of both legs    X-rays were taken of the left ankle and left foot.  It is recorded separately.  I have talked to her and her family member.  She has significant peripheral vascular disease.  She has some arthritis of her ankle but no acute findings.  I have told her to check her feet every day at least every morning and every afternoon with a mirror.  She needs to do this every day without fail.  She needs to talk to her family doctor.  She may need further evaluation of the circulation of the feet by a vascular study or vascular surgeon.  I will see her as needed.

## 2015-08-13 ENCOUNTER — Other Ambulatory Visit (HOSPITAL_COMMUNITY): Payer: Self-pay | Admitting: Nephrology

## 2015-08-13 DIAGNOSIS — I10 Essential (primary) hypertension: Secondary | ICD-10-CM

## 2015-08-25 ENCOUNTER — Ambulatory Visit (HOSPITAL_COMMUNITY)
Admission: RE | Admit: 2015-08-25 | Discharge: 2015-08-25 | Disposition: A | Discharge status: Home / Self Care | Payer: Medicare Other | Source: Ambulatory Visit | Attending: Nephrology | Admitting: Nephrology

## 2015-08-25 DIAGNOSIS — I358 Other nonrheumatic aortic valve disorders: Secondary | ICD-10-CM | POA: Insufficient documentation

## 2015-08-25 DIAGNOSIS — I340 Nonrheumatic mitral (valve) insufficiency: Secondary | ICD-10-CM | POA: Diagnosis not present

## 2015-08-25 DIAGNOSIS — I119 Hypertensive heart disease without heart failure: Secondary | ICD-10-CM | POA: Insufficient documentation

## 2015-08-25 DIAGNOSIS — I10 Essential (primary) hypertension: Secondary | ICD-10-CM | POA: Diagnosis not present

## 2015-08-25 DIAGNOSIS — I515 Myocardial degeneration: Secondary | ICD-10-CM | POA: Insufficient documentation

## 2015-08-25 DIAGNOSIS — I071 Rheumatic tricuspid insufficiency: Secondary | ICD-10-CM | POA: Insufficient documentation

## 2015-08-25 NOTE — Progress Notes (Signed)
*  PRELIMINARY RESULTS* Echocardiogram 2D Echocardiogram has been performed.  Amber Terry 08/25/2015, 1:48 PM

## 2016-01-20 ENCOUNTER — Emergency Department (HOSPITAL_COMMUNITY)
Admission: EM | Admit: 2016-01-20 | Discharge: 2016-01-20 | Disposition: A | Discharge status: Home / Self Care | Payer: Medicare Other | Attending: Emergency Medicine | Admitting: Emergency Medicine

## 2016-01-20 ENCOUNTER — Emergency Department (HOSPITAL_COMMUNITY): Payer: Medicare Other

## 2016-01-20 ENCOUNTER — Encounter (HOSPITAL_COMMUNITY): Payer: Self-pay | Admitting: Emergency Medicine

## 2016-01-20 DIAGNOSIS — M25532 Pain in left wrist: Secondary | ICD-10-CM

## 2016-01-20 DIAGNOSIS — M109 Gout, unspecified: Secondary | ICD-10-CM

## 2016-01-20 DIAGNOSIS — I10 Essential (primary) hypertension: Secondary | ICD-10-CM | POA: Insufficient documentation

## 2016-01-20 DIAGNOSIS — Z79899 Other long term (current) drug therapy: Secondary | ICD-10-CM | POA: Diagnosis not present

## 2016-01-20 DIAGNOSIS — M10042 Idiopathic gout, left hand: Secondary | ICD-10-CM | POA: Diagnosis not present

## 2016-01-20 HISTORY — DX: Gout, unspecified: M10.9

## 2016-01-20 MED ORDER — PREDNISONE 20 MG PO TABS
40.0000 mg | ORAL_TABLET | Freq: Once | ORAL | Status: AC
  Administered 2016-01-20: 40 mg via ORAL
  Filled 2016-01-20: qty 2

## 2016-01-20 MED ORDER — HYDROCODONE-ACETAMINOPHEN 5-325 MG PO TABS
1.0000 | ORAL_TABLET | Freq: Once | ORAL | Status: AC
Start: 2016-01-20 — End: 2016-01-20
  Administered 2016-01-20: 1 via ORAL
  Filled 2016-01-20: qty 1

## 2016-01-20 MED ORDER — PREDNISONE 10 MG PO TABS: 40.0000 mg | ORAL_TABLET | Freq: Every day | ORAL | 0 refills | Status: DC

## 2016-01-20 MED ORDER — HYDROCODONE-ACETAMINOPHEN 5-325 MG PO TABS: 1.0000 | ORAL_TABLET | Freq: Four times a day (QID) | ORAL | 0 refills | Status: DC | PRN

## 2016-01-20 NOTE — ED Notes (Signed)
Patient in wheel chair at bedside trying to call her ride. Patient given peanut butter crackers at this time.

## 2016-01-20 NOTE — Discharge Instructions (Signed)
Take the hydrocodone as needed for pain. Take prednisone for the next 5 days. Make an appointment to follow-up with your regular doctor. X-rays are consistent with gout in the left index finger. May be osteoarthritis involving the left wrist. No evidence of any acute injuries.

## 2016-01-20 NOTE — ED Provider Notes (Signed)
[facility] DEPT Provider Note   CSN: [id number] Arrival date & time: 01/20/16  1404     History   Chief Complaint Chief Complaint  Patient presents with  . Wrist Pain    HPI Amber Terry is a 81 y.o. female.  Patient with complaint of left wrist pain and left finger pain and swelling and redness to both areas that started 2 days ago. Patient has been told that she's had gout in the past which usually involves her foot. She denies any injury or trauma to the hand or wrist.      Past Medical History:  Diagnosis Date  . Arthritis   . Cancer ([place])   . Gout   . Hypertension   . Renal disorder   . Thyroid disease     There are no active problems to display for this patient.   Past Surgical History:  Procedure Laterality Date  . ABDOMINAL HYSTERECTOMY    . CHOLECYSTECTOMY    . THYROIDECTOMY      OB History    No data available       Home Medications    Prior to Admission medications   Medication Sig Start Date End Date Taking? Authorizing Provider  amLODipine (NORVASC) 10 MG tablet Take 10 mg by mouth daily. 12/01/13  Yes Historical Provider, MD  Calcium Citrate-Vitamin D (CALCIUM + D PO) Take by mouth.   Yes Historical Provider, MD  Cholecalciferol (VITAMIN D3) 1000 units CAPS Take by mouth.   Yes Historical Provider, MD  furosemide (LASIX) 20 MG tablet Take 20 mg by mouth daily as needed. 11/06/13  Yes Historical Provider, MD  levothyroxine (SYNTHROID, LEVOTHROID) 112 MCG tablet Take 112 mcg by mouth daily before breakfast.   Yes Historical Provider, MD  lisinopril (PRINIVIL,ZESTRIL) 2.5 MG tablet Take 2.5 mg by mouth daily.   Yes Historical Provider, MD  ranitidine (ZANTAC) 150 MG tablet Take 150 mg by mouth 2 (two) times daily.   Yes Historical Provider, MD  HYDROcodone-acetaminophen (NORCO/VICODIN) 5-325 MG tablet Take 1-2 tablets by mouth every 6 (six) hours as needed. 01/20/16   Fredia Sorrow, MD  predniSONE (DELTASONE) 10 MG tablet Take 4 tablets (40  mg total) by mouth daily. 01/20/16   Fredia Sorrow, MD    Family History History reviewed. No pertinent family history.  Social History Social History  Substance Use Topics  . Smoking status: Never Smoker  . Smokeless tobacco: Never Used  . Alcohol use No     Allergies   Ampicillin   Review of Systems Review of Systems  Constitutional: Negative for fever.  HENT: Negative for congestion.   Eyes: Negative for redness.  Respiratory: Negative for shortness of breath.   Cardiovascular: Negative for chest pain.  Gastrointestinal: Negative for abdominal pain.  Genitourinary: Negative for dysuria.  Musculoskeletal: Positive for arthralgias and joint swelling.  Skin: Negative for wound.  Neurological: Negative for headaches.  Hematological: Does not bruise/bleed easily.  Psychiatric/Behavioral: Negative for confusion.     Physical Exam Updated Vital Signs BP 151/59 (BP Location: Right Arm)   Pulse 83   Temp 97.7 F (36.5 C) (Oral)   Resp 18   Ht 5\' 4"  (1.626 m)   Wt 99.8 kg   SpO2 100%   BMI 37.76 kg/m   Physical Exam  Constitutional: She is oriented to person, place, and time. She appears well-developed and well-nourished. No distress.  HENT:  Head: Normocephalic and atraumatic.  Eyes: Conjunctivae and EOM are normal. Pupils are equal, round,  and reactive to light.  Neck: Normal range of motion. Neck supple.  Cardiovascular: Regular rhythm.   Pulmonary/Chest: Effort normal and breath sounds normal.  Abdominal: Soft. Bowel sounds are normal.  Musculoskeletal: She exhibits edema and tenderness.  Significant redness and swelling at the DIP joint of the left index finger. With some gouty tophi. Also swelling at the wrist at the base of the thumb and some redness there as well. Radial pulses normal.  Neurological: She is alert and oriented to person, place, and time. No cranial nerve deficit. She exhibits normal muscle tone. Coordination normal.  Skin: Skin is warm.  There is erythema.  Nursing note and vitals reviewed.    ED Treatments / Results  Labs (all labs ordered are listed, but only abnormal results are displayed) Labs Reviewed - No data to display  EKG  EKG Interpretation None       Radiology Dg Wrist Complete Left  Result Date: 01/20/2016 CLINICAL DATA:  Left wrist swelling, redness and pain starting 2 days ago. No known injury. History of gout. EXAM: LEFT WRIST - COMPLETE 3+ VIEW COMPARISON:  None. FINDINGS: Advanced degenerative osteoarthritic changes noted at the left first [facility] joint, with associated joint space loss, articular surface sclerosis and prominent osseous spurring/osteophyte formation. There is an associated slight radial subluxation of the first metacarpal bone. No significant degenerative change at the remainder of the [facility] joints. No significant degenerative change amongst the carpal bones or at the radiocarpal joint space. No erosions or other secondary signs of an inflammatory arthritis. No acute or suspicious osseous finding. Soft tissues about the left wrist are unremarkable. IMPRESSION: 1. Degenerative osteoarthritis at the left first [facility] joint, moderate to severe in degree, as detailed above. 2. No acute findings. Electronically Signed   By: Franki Cabot M.D.   On: 01/20/2016 15:01   Dg Hand Complete Left  Result Date: 01/20/2016 CLINICAL DATA:  Patient states her left 2nd digits has been hurting along with her wrist x 2-3 days. The finger is swollen and has a lump at the tip of it. Hx of arthritis and gout. EXAM: LEFT HAND - COMPLETE 3+ VIEW COMPARISON:  None. FINDINGS: Patchy osteopenia throughout the left hand which limits characterization of osseous detail but there is no acute or suspicious osseous finding. There is advanced degenerative osteoarthritis at the first [facility] joint, with associated joint space narrowing and osseous spurring/osteophyte formation. There is also associated radial subluxation of the first  metacarpal bone. There is soft tissue thickening/edema about the second DIP joint, with associated periarticular osseous erosions, suggesting inflammatory arthritis such as gout. Soft tissues appear otherwise unremarkable. Remainder of the visualized joint spaces appear relatively well preserved. IMPRESSION: 1. Soft tissue thickening/edema about the second DIP joint, with associated periarticular osseous erosions, consistent with inflammatory arthritis such as gout. 2. Advanced degenerative osteoarthritis at the first [facility] joint, as detailed above. Electronically Signed   By: Franki Cabot M.D.   On: 01/20/2016 17:59    Procedures Procedures (including critical care time)  Medications Ordered in ED Medications  HYDROcodone-acetaminophen (NORCO/VICODIN) 5-325 MG per tablet 1 tablet (1 tablet Oral Given 01/20/16 1825)  predniSONE (DELTASONE) tablet 40 mg (40 mg Oral Given 01/20/16 1825)     Initial Impression / Assessment and Plan / ED Course  I have reviewed the triage vital signs and the nursing notes.  Pertinent labs & imaging results that were available during my care of the patient were reviewed by me and considered in my medical decision  making (see chart for details).    Symptoms consistent certainly left index finger with gout. X-ray highly suggestive of that. Also significant arthritis in the wrist area. Will treat with prednisone and hydrocodone and follow with primary care doctor. No evidence of any acute bony injury.  Final Clinical Impressions(s) / ED Diagnoses   Final diagnoses:  Left wrist pain  Acute gout of left hand, unspecified cause    New Prescriptions New Prescriptions   HYDROCODONE-ACETAMINOPHEN (NORCO/VICODIN) 5-325 MG TABLET    Take 1-2 tablets by mouth every 6 (six) hours as needed.   PREDNISONE (DELTASONE) 10 MG TABLET    Take 4 tablets (40 mg total) by mouth daily.     Fredia Sorrow, MD 01/20/16 [phone]

## 2016-01-20 NOTE — ED Triage Notes (Signed)
Pt reports L wrist and second finger swelling, redness, and pain started 2 days ago. Denies injury, hx of gout.

## 2016-01-20 NOTE — ED Notes (Signed)
Pt says she notice area to left index finger and left wrist about 2 days ago.  Denies any injury.

## 2016-03-03 DIAGNOSIS — Z79899 Other long term (current) drug therapy: Secondary | ICD-10-CM | POA: Diagnosis not present

## 2016-03-03 DIAGNOSIS — E559 Vitamin D deficiency, unspecified: Secondary | ICD-10-CM | POA: Diagnosis not present

## 2016-03-03 DIAGNOSIS — D509 Iron deficiency anemia, unspecified: Secondary | ICD-10-CM | POA: Diagnosis not present

## 2016-03-03 DIAGNOSIS — I10 Essential (primary) hypertension: Secondary | ICD-10-CM | POA: Diagnosis not present

## 2016-03-03 DIAGNOSIS — R809 Proteinuria, unspecified: Secondary | ICD-10-CM | POA: Diagnosis not present

## 2016-03-03 DIAGNOSIS — N183 Chronic kidney disease, stage 3 (moderate): Secondary | ICD-10-CM | POA: Diagnosis not present

## 2016-03-08 DIAGNOSIS — N183 Chronic kidney disease, stage 3 (moderate): Secondary | ICD-10-CM | POA: Diagnosis not present

## 2016-03-08 DIAGNOSIS — I10 Essential (primary) hypertension: Secondary | ICD-10-CM | POA: Diagnosis not present

## 2016-03-08 DIAGNOSIS — R809 Proteinuria, unspecified: Secondary | ICD-10-CM | POA: Diagnosis not present

## 2016-03-08 DIAGNOSIS — N250 Renal osteodystrophy: Secondary | ICD-10-CM | POA: Diagnosis not present

## 2016-05-29 ENCOUNTER — Encounter (HOSPITAL_COMMUNITY): Payer: Self-pay | Admitting: Emergency Medicine

## 2016-05-29 ENCOUNTER — Emergency Department (HOSPITAL_COMMUNITY)
Admission: EM | Admit: 2016-05-29 | Discharge: 2016-05-29 | Disposition: A | Discharge status: Home / Self Care | Payer: PPO | Attending: Emergency Medicine | Admitting: Emergency Medicine

## 2016-05-29 ENCOUNTER — Emergency Department (HOSPITAL_COMMUNITY): Payer: PPO

## 2016-05-29 DIAGNOSIS — M25561 Pain in right knee: Secondary | ICD-10-CM

## 2016-05-29 DIAGNOSIS — Z79899 Other long term (current) drug therapy: Secondary | ICD-10-CM | POA: Diagnosis not present

## 2016-05-29 DIAGNOSIS — M10061 Idiopathic gout, right knee: Secondary | ICD-10-CM | POA: Diagnosis not present

## 2016-05-29 DIAGNOSIS — M25461 Effusion, right knee: Secondary | ICD-10-CM | POA: Insufficient documentation

## 2016-05-29 DIAGNOSIS — I10 Essential (primary) hypertension: Secondary | ICD-10-CM | POA: Diagnosis not present

## 2016-05-29 DIAGNOSIS — M109 Gout, unspecified: Secondary | ICD-10-CM | POA: Insufficient documentation

## 2016-05-29 DIAGNOSIS — R404 Transient alteration of awareness: Secondary | ICD-10-CM | POA: Diagnosis not present

## 2016-05-29 DIAGNOSIS — R531 Weakness: Secondary | ICD-10-CM | POA: Diagnosis not present

## 2016-05-29 LAB — SYNOVIAL CELL COUNT + DIFF, W/ CRYSTALS
EOSINOPHILS-SYNOVIAL: 0 % (ref 0–1)
LYMPHOCYTES-SYNOVIAL FLD: 0 % (ref 0–20)
MONOCYTE-MACROPHAGE-SYNOVIAL FLUID: 5 % — AB (ref 50–90)
NEUTROPHIL, SYNOVIAL: 95 % — AB (ref 0–25)
WBC, SYNOVIAL: 62700 /mm3 — AB (ref 0–200)

## 2016-05-29 LAB — GRAM STAIN

## 2016-05-29 MED ORDER — HYDROCODONE-ACETAMINOPHEN 5-325 MG PO TABS: 2.0000 | ORAL_TABLET | ORAL | 0 refills | Status: DC | PRN

## 2016-05-29 MED ORDER — HYDROCODONE-ACETAMINOPHEN 5-325 MG PO TABS: 1.0000 | ORAL_TABLET | ORAL | 0 refills | Status: DC | PRN

## 2016-05-29 MED ORDER — LIDOCAINE-EPINEPHRINE (PF) 1 %-1:200000 IJ SOLN
10.0000 mL | Freq: Once | INTRAMUSCULAR | Status: AC
  Administered 2016-05-29: 10 mL via INTRADERMAL
  Filled 2016-05-29: qty 30

## 2016-05-29 MED ORDER — HYDROCODONE-ACETAMINOPHEN 5-325 MG PO TABS
2.0000 | ORAL_TABLET | Freq: Once | ORAL | Status: AC
  Administered 2016-05-29: 2 via ORAL
  Filled 2016-05-29: qty 2

## 2016-05-29 MED ORDER — PREDNISONE 20 MG PO TABS: ORAL_TABLET | ORAL | 0 refills | Status: DC

## 2016-05-29 NOTE — ED Notes (Signed)
Gave patient meal tray as requested and approved by Dr Reather Converse.

## 2016-05-29 NOTE — Discharge Instructions (Signed)
For severe pain take norco or vicodin however realize they have the potential for addiction and it can make you sleepy and has tylenol in it.  No operating machinery while taking. Return for uncontrolled, pain, fevers, vomiting, other  If you were given medicines take as directed.  If you are on coumadin or contraceptives realize their levels and effectiveness is altered by many different medicines.  If you have any reaction (rash, tongues swelling, other) to the medicines stop taking and see a physician.    If your blood pressure was elevated in the ER make sure you follow up for management with a primary doctor or return for chest pain, shortness of breath or stroke symptoms.  Please follow up as directed and return to the ER or see a physician for new or worsening symptoms.  Thank you. Vitals:   05/29/16 1001 05/29/16 1030 05/29/16 1100 05/29/16 1130  BP:  (!) 153/70 (!) 114/54 (!) 124/47  Pulse:  77 63 (!) 57  Resp:    20  Temp:      TempSrc:      SpO2:  99% 95% 93%  Weight: 108.9 kg (240 lb)     Height: 5\' 4"  (1.626 m)

## 2016-05-29 NOTE — ED Triage Notes (Addendum)
Patient brought in by EMS, states she was at [employer] [place] yesterday and ambulating fine but complaining of right knee pain and swelling starting last night. Patient has history of gout. Denies injury. Patient also has redness and swelling noted to joints of right first and second finger.

## 2016-05-29 NOTE — ED Provider Notes (Addendum)
[facility] DEPT Provider Note   CSN: [id number] Arrival date & time: 05/29/16  3474  By signing my name below, I, Marcello Moores, attest that this documentation has been prepared under the direction and in the presence of Elnora Morrison, MD. Electronically Signed: Marcello Moores, ED Scribe. 05/29/16. 11:01 AM.  History   Chief Complaint Chief Complaint  Patient presents with  . Knee Pain   The history is provided by the patient. No language interpreter was used.   HPI Comments: Amber Terry is a 81 y.o. female BIB EMS, with a hx of gout and arthritis, who presents to the Emergency Department complaining of constant, moderate, gradually worsening chronic right knee pain, that began last night. Pt has associated joint swelling and leg swelling at the site. The pt reports that she was at the [place] yesterday and ambulating normally when her symptoms began. She denies having infections in those joints, and denies being on any blood thinners. The pt states that she is no longer taking prednisone. The pt additionally complains of redness and swelling to the right index and middle fingers.   Past Medical History:  Diagnosis Date  . Arthritis   . Cancer ([place])   . Gout   . Hypertension   . Renal disorder   . Thyroid disease     There are no active problems to display for this patient.   Past Surgical History:  Procedure Laterality Date  . ABDOMINAL HYSTERECTOMY    . CHOLECYSTECTOMY    . THYROIDECTOMY      OB History    No data available       Home Medications    Prior to Admission medications   Medication Sig Start Date End Date Taking? Authorizing Provider  amLODipine (NORVASC) 10 MG tablet Take 10 mg by mouth daily. 12/01/13  Yes [provider]  Cholecalciferol (VITAMIN D3) 1000 units CAPS Take by mouth.   Yes [provider]  furosemide (LASIX) 20 MG tablet Take 20 mg by mouth daily as needed. 11/06/13  Yes [provider]  levothyroxine  (SYNTHROID, LEVOTHROID) 112 MCG tablet Take 112 mcg by mouth daily before breakfast.   Yes [provider]  lisinopril (PRINIVIL,ZESTRIL) 2.5 MG tablet Take 2.5 mg by mouth daily.   Yes [provider]  Omega-3 Fatty Acids (FISH OIL PO) Take 1 capsule by mouth daily.   Yes [provider]  HYDROcodone-acetaminophen (NORCO) 5-325 MG tablet Take 1-2 tablets by mouth every 4 (four) hours as needed. 05/29/16   Elnora Morrison, MD  HYDROcodone-acetaminophen (NORCO/VICODIN) 5-325 MG tablet Take 1-2 tablets by mouth every 6 (six) hours as needed. Patient not taking: Reported on 05/29/2016 01/20/16   Fredia Sorrow, MD  predniSONE (DELTASONE) 10 MG tablet Take 4 tablets (40 mg total) by mouth daily. Patient not taking: Reported on 05/29/2016 01/20/16   Fredia Sorrow, MD  predniSONE (DELTASONE) 20 MG tablet 2 tabs po daily x 4 days 05/29/16   Elnora Morrison, MD    Family History History reviewed. No pertinent family history.  Social History Social History  Substance Use Topics  . Smoking status: Never Smoker  . Smokeless tobacco: Never Used  . Alcohol use No     Allergies   Ampicillin   Review of Systems Review of Systems  Constitutional: Negative for chills and fever.  Musculoskeletal: Positive for arthralgias and joint swelling.  All other systems reviewed and are negative.    Physical Exam Updated Vital Signs BP (!) 139/42   Pulse Marland Kitchen)  57   Temp 98 F (36.7 C) (Oral)   Resp 20   Ht 5\' 4"  (1.626 m)   Wt 108.9 kg (240 lb)   SpO2 96%   BMI 41.20 kg/m   Physical Exam  Constitutional: She is oriented to person, place, and time. She appears well-developed and well-nourished. No distress.  HENT:  Head: Normocephalic.  Eyes: EOM are normal.  Neck: Normal range of motion.  Cardiovascular: Normal rate, regular rhythm, normal heart sounds and intact distal pulses.  Exam reveals no gallop and no friction rub.   No murmur heard. Pulmonary/Chest: Effort  normal.  Abdominal: She exhibits no distension. There is no tenderness.  Musculoskeletal: Normal range of motion.  Significant large right knee tender to palpation Significant pain with flexion to the knee Good distal pulses distal in right leg No erythema or rash on the right leg Evidence of gout in right hand  Neurological: She is alert and oriented to person, place, and time.  Psychiatric: She has a normal mood and affect.  Nursing note and vitals reviewed.    ED Treatments / Results   DIAGNOSTIC STUDIES: Oxygen Saturation is 98% on RA, normal by my interpretation.   COORDINATION OF CARE: 10:08 AM-Discussed next steps with pt. Pt verbalized understanding and is agreeable with the plan.   Labs (all labs ordered are listed, but only abnormal results are displayed) Labs Reviewed  SYNOVIAL CELL COUNT + DIFF, W/ CRYSTALS - Abnormal; Notable for the following:       Result Value   Color, Synovial YELLOW (*)    Appearance-Synovial CLOUDY (*)    WBC, Synovial 62,700 (*)    All other components within normal limits  BODY FLUID CULTURE  GRAM STAIN    EKG  EKG Interpretation None       Radiology Dg Knee Complete 4 Views Right  Result Date: 05/29/2016 CLINICAL DATA:  Right knee pain and swelling since Saturday EXAM: RIGHT KNEE - COMPLETE 4+ VIEW COMPARISON:  None. FINDINGS: No acute fracture or dislocation. Severe lateral femorotibial compartment joint space narrowing with subchondral sclerosis and marginal osteophytosis. Mild medial femorotibial compartment joint space narrowing. Subchondral sclerosis in the medial femoral condyle which may be secondary to osteoarthritic changes versus an insufficiency fracture. Small joint effusion. Generalized osteopenia. No soft tissue abnormality. Peripheral vascular atherosclerotic disease. IMPRESSION: 1. Subchondral sclerosis in the medial femoral condyle which may be secondary to osteoarthritic changes versus an insufficiency fracture.  2. Severe osteoarthritis of the lateral femorotibial compartment. Electronically Signed   By: Kathreen Devoid   On: 05/29/2016 10:55    Procedures .Joint Aspiration/Arthrocentesis Date/Time: 05/29/2016 1:00 PM Performed by: Elnora Morrison Authorized by: Elnora Morrison   Consent:    Consent obtained:  Verbal   Consent given by:  Patient   Risks discussed:  Infection, bleeding and pain   Alternatives discussed:  Delayed treatment Location:    Location:  Knee Anesthesia (see MAR for exact dosages):    Anesthesia method:  Local infiltration   Local anesthetic:  Lidocaine 1% WITH epi Procedure details:    Preparation: Patient was prepped and draped in usual sterile fashion     Needle gauge:  18 G   Ultrasound guidance: no     Approach:  Lateral   Aspirate characteristics:  Yellow   Steroid injected: no     Specimen collected: yes   Post-procedure details:    Patient tolerance of procedure:  Tolerated well, no immediate complications Comments:     50 CC  removed   (including critical care time)  Medications Ordered in ED Medications  HYDROcodone-acetaminophen (NORCO/VICODIN) 5-325 MG per tablet 2 tablet (2 tablets Oral Given 05/29/16 1030)  lidocaine-EPINEPHrine (XYLOCAINE-EPINEPHrine) 1 %-1:200000 (PF) injection 10 mL (10 mLs Intradermal Given 05/29/16 1050)     Initial Impression / Assessment and Plan / ED Course  I have reviewed the triage vital signs and the nursing notes.  Pertinent labs & imaging results that were available during my care of the patient were reviewed by me and considered in my medical decision making (see chart for details).    Patient presents with worsening knee pain and effusion. Discussed likely, nation of gout and arthritis. X-ray confirmed arthritis. No fever. No external sign of infection. Pain meds given pain improved. Lab sample sent plan for likely orthopedic follow-up.  Delay in lab, called. Suspect gout clinically.  Patient updated on delay, meal  ordered.   Plan for close follow-up with orthopedic physician to monitor culture results and for reassessment in 24 hours. Results and differential diagnosis were discussed with the patient/parent/guardian. Xrays were independently reviewed by myself.  Close follow up outpatient was discussed, comfortable with the plan.   Medications  HYDROcodone-acetaminophen (NORCO/VICODIN) 5-325 MG per tablet 2 tablet (2 tablets Oral Given 05/29/16 1030)  lidocaine-EPINEPHrine (XYLOCAINE-EPINEPHrine) 1 %-1:200000 (PF) injection 10 mL (10 mLs Intradermal Given 05/29/16 1050)    Vitals:   05/29/16 1230 05/29/16 1300 05/29/16 1330 05/29/16 1400  BP: (!) 134/52 (!) 132/43 (!) 127/38 (!) 139/42  Pulse: 67 61 60 (!) 57  Resp:      Temp:      TempSrc:      SpO2: 96% 91% 93% 96%  Weight:      Height:        Final diagnoses:  Effusion of right knee joint  Acute pain of right knee  Acute idiopathic gout of right knee    Final Clinical Impressions(s) / ED Diagnoses   Final diagnoses:  Effusion of right knee joint  Acute pain of right knee  Acute idiopathic gout of right knee    New Prescriptions New Prescriptions   HYDROCODONE-ACETAMINOPHEN (NORCO) 5-325 MG TABLET    Take 1-2 tablets by mouth every 4 (four) hours as needed.   PREDNISONE (DELTASONE) 20 MG TABLET    2 tabs po daily x 4 days        Elnora Morrison, MD 05/29/16 1510    Elnora Morrison, MD 05/29/16 [phone]

## 2016-05-29 NOTE — ED Provider Notes (Signed)
No pharmacies open now, so pt given a AP norco pack of 6 norco pills.   Isla Pence, MD 05/29/16 [phone]

## 2016-05-29 NOTE — ED Notes (Signed)
Gave patient water to drink as requested for fluid challenge.

## 2016-06-05 LAB — CULTURE, BODY FLUID-BOTTLE: CULTURE: NO GROWTH

## 2016-06-05 MED FILL — Hydrocodone-Acetaminophen Tab 5-325 MG: ORAL | Qty: 6 | Status: AC

## 2016-07-10 DIAGNOSIS — D509 Iron deficiency anemia, unspecified: Secondary | ICD-10-CM | POA: Diagnosis not present

## 2016-07-10 DIAGNOSIS — R809 Proteinuria, unspecified: Secondary | ICD-10-CM | POA: Diagnosis not present

## 2016-07-10 DIAGNOSIS — Z79899 Other long term (current) drug therapy: Secondary | ICD-10-CM | POA: Diagnosis not present

## 2016-07-10 DIAGNOSIS — N183 Chronic kidney disease, stage 3 (moderate): Secondary | ICD-10-CM | POA: Diagnosis not present

## 2016-07-10 DIAGNOSIS — E559 Vitamin D deficiency, unspecified: Secondary | ICD-10-CM | POA: Diagnosis not present

## 2016-07-10 DIAGNOSIS — I10 Essential (primary) hypertension: Secondary | ICD-10-CM | POA: Diagnosis not present

## 2016-07-14 DIAGNOSIS — R600 Localized edema: Secondary | ICD-10-CM | POA: Diagnosis not present

## 2016-07-14 DIAGNOSIS — Z6837 Body mass index (BMI) 37.0-37.9, adult: Secondary | ICD-10-CM | POA: Diagnosis not present

## 2016-07-14 DIAGNOSIS — N183 Chronic kidney disease, stage 3 (moderate): Secondary | ICD-10-CM | POA: Diagnosis not present

## 2016-07-14 DIAGNOSIS — H9193 Unspecified hearing loss, bilateral: Secondary | ICD-10-CM | POA: Diagnosis not present

## 2016-07-14 DIAGNOSIS — M1A9XX1 Chronic gout, unspecified, with tophus (tophi): Secondary | ICD-10-CM | POA: Diagnosis not present

## 2016-07-28 DIAGNOSIS — M1711 Unilateral primary osteoarthritis, right knee: Secondary | ICD-10-CM | POA: Diagnosis not present

## 2016-07-28 DIAGNOSIS — M1A9XX1 Chronic gout, unspecified, with tophus (tophi): Secondary | ICD-10-CM | POA: Diagnosis not present

## 2016-07-28 DIAGNOSIS — F411 Generalized anxiety disorder: Secondary | ICD-10-CM | POA: Diagnosis not present

## 2016-07-28 DIAGNOSIS — Z6837 Body mass index (BMI) 37.0-37.9, adult: Secondary | ICD-10-CM | POA: Diagnosis not present

## 2016-08-03 ENCOUNTER — Other Ambulatory Visit (HOSPITAL_COMMUNITY): Payer: Self-pay | Admitting: Family Medicine

## 2016-08-03 DIAGNOSIS — Z1231 Encounter for screening mammogram for malignant neoplasm of breast: Secondary | ICD-10-CM

## 2016-08-03 DIAGNOSIS — E2839 Other primary ovarian failure: Secondary | ICD-10-CM

## 2016-08-10 ENCOUNTER — Ambulatory Visit (HOSPITAL_COMMUNITY): Payer: PPO

## 2016-08-10 ENCOUNTER — Encounter (HOSPITAL_COMMUNITY): Payer: Self-pay

## 2016-11-02 DIAGNOSIS — I10 Essential (primary) hypertension: Secondary | ICD-10-CM | POA: Diagnosis not present

## 2016-11-02 DIAGNOSIS — Z79899 Other long term (current) drug therapy: Secondary | ICD-10-CM | POA: Diagnosis not present

## 2016-11-02 DIAGNOSIS — E559 Vitamin D deficiency, unspecified: Secondary | ICD-10-CM | POA: Diagnosis not present

## 2016-11-02 DIAGNOSIS — D509 Iron deficiency anemia, unspecified: Secondary | ICD-10-CM | POA: Diagnosis not present

## 2016-11-02 DIAGNOSIS — N183 Chronic kidney disease, stage 3 (moderate): Secondary | ICD-10-CM | POA: Diagnosis not present

## 2016-11-02 DIAGNOSIS — R809 Proteinuria, unspecified: Secondary | ICD-10-CM | POA: Diagnosis not present

## 2016-11-08 DIAGNOSIS — R809 Proteinuria, unspecified: Secondary | ICD-10-CM | POA: Diagnosis not present

## 2016-11-08 DIAGNOSIS — I509 Heart failure, unspecified: Secondary | ICD-10-CM | POA: Diagnosis not present

## 2016-11-08 DIAGNOSIS — N183 Chronic kidney disease, stage 3 (moderate): Secondary | ICD-10-CM | POA: Diagnosis not present

## 2016-11-08 DIAGNOSIS — I10 Essential (primary) hypertension: Secondary | ICD-10-CM | POA: Diagnosis not present

## 2016-11-08 DIAGNOSIS — D649 Anemia, unspecified: Secondary | ICD-10-CM | POA: Diagnosis not present

## 2016-11-08 DIAGNOSIS — N250 Renal osteodystrophy: Secondary | ICD-10-CM | POA: Diagnosis not present

## 2017-02-07 DIAGNOSIS — E559 Vitamin D deficiency, unspecified: Secondary | ICD-10-CM | POA: Diagnosis not present

## 2017-02-07 DIAGNOSIS — D509 Iron deficiency anemia, unspecified: Secondary | ICD-10-CM | POA: Diagnosis not present

## 2017-02-07 DIAGNOSIS — I10 Essential (primary) hypertension: Secondary | ICD-10-CM | POA: Diagnosis not present

## 2017-02-07 DIAGNOSIS — Z79899 Other long term (current) drug therapy: Secondary | ICD-10-CM | POA: Diagnosis not present

## 2017-02-07 DIAGNOSIS — R809 Proteinuria, unspecified: Secondary | ICD-10-CM | POA: Diagnosis not present

## 2017-02-07 DIAGNOSIS — N183 Chronic kidney disease, stage 3 (moderate): Secondary | ICD-10-CM | POA: Diagnosis not present

## 2017-03-02 DIAGNOSIS — R809 Proteinuria, unspecified: Secondary | ICD-10-CM | POA: Diagnosis not present

## 2017-03-02 DIAGNOSIS — I509 Heart failure, unspecified: Secondary | ICD-10-CM | POA: Diagnosis not present

## 2017-03-02 DIAGNOSIS — N183 Chronic kidney disease, stage 3 (moderate): Secondary | ICD-10-CM | POA: Diagnosis not present

## 2017-03-02 DIAGNOSIS — I10 Essential (primary) hypertension: Secondary | ICD-10-CM | POA: Diagnosis not present

## 2017-03-05 DIAGNOSIS — Z681 Body mass index (BMI) 19 or less, adult: Secondary | ICD-10-CM | POA: Diagnosis not present

## 2017-03-05 DIAGNOSIS — R7309 Other abnormal glucose: Secondary | ICD-10-CM | POA: Diagnosis not present

## 2017-03-05 DIAGNOSIS — Z1389 Encounter for screening for other disorder: Secondary | ICD-10-CM | POA: Diagnosis not present

## 2017-03-05 DIAGNOSIS — R600 Localized edema: Secondary | ICD-10-CM | POA: Diagnosis not present

## 2017-03-05 DIAGNOSIS — L089 Local infection of the skin and subcutaneous tissue, unspecified: Secondary | ICD-10-CM | POA: Diagnosis not present

## 2017-04-14 ENCOUNTER — Encounter (HOSPITAL_COMMUNITY): Payer: Self-pay

## 2017-04-14 ENCOUNTER — Observation Stay (HOSPITAL_COMMUNITY)
Admission: EM | Admit: 2017-04-14 | Discharge: 2017-04-15 | Disposition: A | Discharge status: Home / Self Care | Payer: PPO | Attending: Internal Medicine | Admitting: Internal Medicine

## 2017-04-14 ENCOUNTER — Other Ambulatory Visit: Payer: Self-pay

## 2017-04-14 ENCOUNTER — Emergency Department (HOSPITAL_COMMUNITY): Payer: PPO

## 2017-04-14 ENCOUNTER — Observation Stay (HOSPITAL_COMMUNITY): Payer: PPO

## 2017-04-14 ENCOUNTER — Observation Stay (HOSPITAL_BASED_OUTPATIENT_CLINIC_OR_DEPARTMENT_OTHER): Payer: PPO

## 2017-04-14 DIAGNOSIS — Z7982 Long term (current) use of aspirin: Secondary | ICD-10-CM | POA: Insufficient documentation

## 2017-04-14 DIAGNOSIS — R42 Dizziness and giddiness: Secondary | ICD-10-CM | POA: Diagnosis present

## 2017-04-14 DIAGNOSIS — Z85850 Personal history of malignant neoplasm of thyroid: Secondary | ICD-10-CM | POA: Insufficient documentation

## 2017-04-14 DIAGNOSIS — Z79899 Other long term (current) drug therapy: Secondary | ICD-10-CM | POA: Diagnosis not present

## 2017-04-14 DIAGNOSIS — M109 Gout, unspecified: Secondary | ICD-10-CM | POA: Insufficient documentation

## 2017-04-14 DIAGNOSIS — I5032 Chronic diastolic (congestive) heart failure: Secondary | ICD-10-CM | POA: Insufficient documentation

## 2017-04-14 DIAGNOSIS — I110 Hypertensive heart disease with heart failure: Secondary | ICD-10-CM | POA: Diagnosis not present

## 2017-04-14 DIAGNOSIS — I10 Essential (primary) hypertension: Secondary | ICD-10-CM

## 2017-04-14 DIAGNOSIS — E785 Hyperlipidemia, unspecified: Secondary | ICD-10-CM | POA: Diagnosis not present

## 2017-04-14 DIAGNOSIS — I5030 Unspecified diastolic (congestive) heart failure: Secondary | ICD-10-CM

## 2017-04-14 DIAGNOSIS — G459 Transient cerebral ischemic attack, unspecified: Secondary | ICD-10-CM | POA: Diagnosis not present

## 2017-04-14 DIAGNOSIS — I6523 Occlusion and stenosis of bilateral carotid arteries: Secondary | ICD-10-CM | POA: Diagnosis not present

## 2017-04-14 DIAGNOSIS — R531 Weakness: Secondary | ICD-10-CM | POA: Diagnosis not present

## 2017-04-14 LAB — COMPREHENSIVE METABOLIC PANEL
ALK PHOS: 86 U/L (ref 38–126)
ALT: 11 U/L — ABNORMAL LOW (ref 14–54)
ANION GAP: 14 (ref 5–15)
AST: 13 U/L — ABNORMAL LOW (ref 15–41)
Albumin: 3.7 g/dL (ref 3.5–5.0)
BUN: 28 mg/dL — ABNORMAL HIGH (ref 6–20)
CALCIUM: 9.5 mg/dL (ref 8.9–10.3)
CO2: 25 mmol/L (ref 22–32)
CREATININE: 1.3 mg/dL — AB (ref 0.44–1.00)
Chloride: 103 mmol/L (ref 101–111)
GFR, EST AFRICAN AMERICAN: 44 mL/min — AB (ref >60)
GFR, EST NON AFRICAN AMERICAN: 38 mL/min — AB (ref >60)
Glucose, Bld: 163 mg/dL — ABNORMAL HIGH (ref 65–99)
Potassium: 3.5 mmol/L (ref 3.5–5.1)
SODIUM: 142 mmol/L (ref 135–145)
TOTAL PROTEIN: 7 g/dL (ref 6.5–8.1)
Total Bilirubin: 0.5 mg/dL (ref 0.3–1.2)

## 2017-04-14 LAB — PROTIME-INR
INR: 0.91
PROTHROMBIN TIME: 12.1 s (ref 11.4–15.2)

## 2017-04-14 LAB — MRSA PCR SCREENING: MRSA BY PCR: NEGATIVE

## 2017-04-14 LAB — URINALYSIS, ROUTINE W REFLEX MICROSCOPIC
BILIRUBIN URINE: NEGATIVE
Glucose, UA: NEGATIVE mg/dL
Hgb urine dipstick: NEGATIVE
KETONES UR: NEGATIVE mg/dL
Leukocytes, UA: NEGATIVE
NITRITE: NEGATIVE
PH: 6 (ref 5.0–8.0)
Protein, ur: NEGATIVE mg/dL
SPECIFIC GRAVITY, URINE: 1.006 (ref 1.005–1.030)

## 2017-04-14 LAB — CBC WITH DIFFERENTIAL/PLATELET
BASOS ABS: 0 10*3/uL (ref 0.0–0.1)
BASOS PCT: 0 %
EOS ABS: 0.1 10*3/uL (ref 0.0–0.7)
EOS PCT: 2 %
HCT: 31.8 % — ABNORMAL LOW (ref 36.0–46.0)
HEMOGLOBIN: 10.3 g/dL — AB (ref 12.0–15.0)
Lymphocytes Relative: 29 %
Lymphs Abs: 1.8 10*3/uL (ref 0.7–4.0)
MCH: 31.3 pg (ref 26.0–34.0)
MCHC: 32.4 g/dL (ref 30.0–36.0)
MCV: 96.7 fL (ref 78.0–100.0)
Monocytes Absolute: 0.5 10*3/uL (ref 0.1–1.0)
Monocytes Relative: 8 %
NEUTROS PCT: 61 %
Neutro Abs: 3.7 10*3/uL (ref 1.7–7.7)
PLATELETS: 176 10*3/uL (ref 150–400)
RBC: 3.29 MIL/uL — AB (ref 3.87–5.11)
RDW: 13.8 % (ref 11.5–15.5)
WBC: 6 10*3/uL (ref 4.0–10.5)

## 2017-04-14 LAB — TROPONIN I

## 2017-04-14 LAB — ECHOCARDIOGRAM COMPLETE
Height: 63 in
Weight: 3343.94 oz

## 2017-04-14 LAB — APTT: aPTT: 30 seconds (ref 24–36)

## 2017-04-14 LAB — TSH: TSH: 10.592 u[IU]/mL — AB (ref 0.350–4.500)

## 2017-04-14 MED ORDER — ALLOPURINOL 100 MG PO TABS
200.0000 mg | ORAL_TABLET | Freq: Every day | ORAL | Status: DC
  Administered 2017-04-14 – 2017-04-15 (×2): 200 mg via ORAL
  Filled 2017-04-14 (×2): qty 2

## 2017-04-14 MED ORDER — ENOXAPARIN SODIUM 40 MG/0.4ML ~~LOC~~ SOLN
40.0000 mg | SUBCUTANEOUS | Status: DC
  Administered 2017-04-14 – 2017-04-15 (×2): 40 mg via SUBCUTANEOUS
  Filled 2017-04-14 (×2): qty 0.4

## 2017-04-14 MED ORDER — HYDROCODONE-ACETAMINOPHEN 5-325 MG PO TABS
1.0000 | ORAL_TABLET | ORAL | Status: DC | PRN
  Administered 2017-04-14: 2 via ORAL
  Filled 2017-04-14: qty 2

## 2017-04-14 MED ORDER — LISINOPRIL 5 MG PO TABS
2.5000 mg | ORAL_TABLET | Freq: Every day | ORAL | Status: DC
  Administered 2017-04-14 – 2017-04-15 (×2): 2.5 mg via ORAL
  Filled 2017-04-14 (×2): qty 1

## 2017-04-14 MED ORDER — ACETAMINOPHEN 650 MG RE SUPP: 650.0000 mg | Freq: Four times a day (QID) | RECTAL | Status: DC | PRN

## 2017-04-14 MED ORDER — ATORVASTATIN CALCIUM 40 MG PO TABS: 80.0000 mg | ORAL_TABLET | Freq: Every day | ORAL | Status: DC

## 2017-04-14 MED ORDER — OMEGA-3-ACID ETHYL ESTERS 1 G PO CAPS
1.0000 g | ORAL_CAPSULE | Freq: Every day | ORAL | Status: DC
  Administered 2017-04-14 – 2017-04-15 (×2): 1 g via ORAL
  Filled 2017-04-14 (×2): qty 1

## 2017-04-14 MED ORDER — ACETAMINOPHEN 325 MG PO TABS: 650.0000 mg | ORAL_TABLET | Freq: Four times a day (QID) | ORAL | Status: DC | PRN

## 2017-04-14 MED ORDER — HYDRALAZINE HCL 20 MG/ML IJ SOLN: 10.0000 mg | INTRAMUSCULAR | Status: DC | PRN

## 2017-04-14 MED ORDER — SODIUM CHLORIDE 0.9% FLUSH
3.0000 mL | Freq: Two times a day (BID) | INTRAVENOUS | Status: DC
  Administered 2017-04-14 – 2017-04-15 (×2): 3 mL via INTRAVENOUS

## 2017-04-14 MED ORDER — HYDRALAZINE HCL 25 MG PO TABS
25.0000 mg | ORAL_TABLET | Freq: Three times a day (TID) | ORAL | Status: DC
  Administered 2017-04-14 – 2017-04-15 (×4): 25 mg via ORAL
  Filled 2017-04-14 (×4): qty 1

## 2017-04-14 MED ORDER — STROKE: EARLY STAGES OF RECOVERY BOOK
Freq: Once | Status: AC
  Administered 2017-04-14: 1
  Filled 2017-04-14 (×2): qty 1

## 2017-04-14 MED ORDER — LEVOTHYROXINE SODIUM 112 MCG PO TABS
112.0000 ug | ORAL_TABLET | Freq: Every day | ORAL | Status: DC
  Administered 2017-04-15: 112 ug via ORAL
  Filled 2017-04-14: qty 1

## 2017-04-14 MED ORDER — ONDANSETRON HCL 4 MG/2ML IJ SOLN: 4.0000 mg | Freq: Four times a day (QID) | INTRAMUSCULAR | Status: DC | PRN

## 2017-04-14 MED ORDER — ONDANSETRON HCL 4 MG PO TABS
4.0000 mg | ORAL_TABLET | Freq: Four times a day (QID) | ORAL | Status: DC | PRN
Start: 2017-04-14 — End: 2017-04-15

## 2017-04-14 MED ORDER — AMLODIPINE BESYLATE 5 MG PO TABS
10.0000 mg | ORAL_TABLET | Freq: Every day | ORAL | Status: DC
  Administered 2017-04-14 – 2017-04-15 (×2): 10 mg via ORAL
  Filled 2017-04-14 (×2): qty 2

## 2017-04-14 MED ORDER — SODIUM CHLORIDE 0.9% FLUSH: 3.0000 mL | INTRAVENOUS | Status: DC | PRN

## 2017-04-14 MED ORDER — ASPIRIN 300 MG RE SUPP: 300.0000 mg | Freq: Every day | RECTAL | Status: DC

## 2017-04-14 MED ORDER — SODIUM CHLORIDE 0.9 % IV SOLN: 250.0000 mL | INTRAVENOUS | Status: DC | PRN

## 2017-04-14 MED ORDER — ASPIRIN 325 MG PO TABS
325.0000 mg | ORAL_TABLET | Freq: Every day | ORAL | Status: DC
  Administered 2017-04-14 – 2017-04-15 (×2): 325 mg via ORAL
  Filled 2017-04-14 (×2): qty 1

## 2017-04-14 NOTE — Progress Notes (Signed)
*  PRELIMINARY RESULTS* Echocardiogram 2D Echocardiogram has been performed.  Samuel Germany 04/14/2017, 12:59 PM

## 2017-04-14 NOTE — Progress Notes (Signed)
   [facility] Tele[facility]urology Consult [facility]  Impression:  TIA right hemispheric cannot be excluded.     Not a tpa candidate due to: exam nonfocal, patient at baseline Not an NIR candidate due to: NIHSS 0   Differential Diagnosis:   1. Stroke 2. TIA 3. Cervical radiculopathy    Recommendations:  MRI brain, if unable to obtain, may repeat CT tomorrow Diona Fanti if no contraindications inpatient neurology consultation Discussed with ED MD  -----------------------------------------------------------------------------------------  CC left arm sensory deficit.   History of Present Illness   Patient is a  82 yr old with PMH of thyroid ca, HTN, cholecystectomy, hysterectomy, HLD who presents for a constellation of symptoms. She was showering and noted the water on her left arm did not feel as hot. Per the daughter, her BP was very high. She had a softer speech, no slurring no difficulty articulating. No weakness, no gait changes. She uses a walker at baseline. No visual changes or headaches. No vertigo or dizziness. No neck pain. NO falls. Patient states she is at baseline and feels well now.   Diagnostic: CT brain: no acute findings.   Exam:  1a- LOC: Keenly responsive - 0   1b- LOC questions: Answers both questions correctly - 0     1c- LOC commands- Performs both tasks correctly- 0     2- Gaze: Normal; no gaze paresis or gaze deviation - 0     3- Visual Fields: normal, no Visual field deficit - 0     4- Facial movements: no facial palsy - 0     5- Upper limb motor - no drift -0     6- Lower limb motor - no drift - 0      7- Limb Coordination: absent ataxia - 0      8- Sensory : No sensory loss - 0     9- Language - No aphasia - 0      10- Speech - No dysarthria -0     11- Neglect / Extinction - none found -0     NIHSS score 0      Medical Decision Making:  - Extensive number of diagnosis or management options are considered above.   - Extensive amount of complex data  reviewed.   - High risk of complication and/or morbidity or mortality are associated with differential diagnostic considerations above.  - There may be Uncertain outcome and increased probability of prolonged functional impairment or high probability of severe prolonged functional impairment associated with some of these differential diagnosis.  Medical Data Reviewed:  1.Data reviewed include clinical labs, radiology,  Medical Tests;   2.Tests results discussed w/performing or interpreting physician;   3.Obtaining/reviewing old medical records;  4.Obtaining case history from another source;  5.Independent review of image, tracing or specimen.    Patient was informed the Neurology Consult would happen via telehealth (remote video) and consented to receiving care in this manner.

## 2017-04-14 NOTE — ED Notes (Signed)
Patient transported to CT 

## 2017-04-14 NOTE — H&P (Addendum)
History and Physical    Amber Terry ASN:[id number] DOB: December 21, 1935 DOA: 04/14/2017  PCP: Redmond School, MD   Patient coming from: Home  Chief Complaint: Left arm sensory deficit  HPI: Amber Terry is a 82 y.o. female with medical history significant for hypertension, dyslipidemia, prior thyroid cancer, and gout who presented to the ED upon noticing that she could not feel hot water on her left arm while she was showering.  This is all in the setting of some altered mentation that began early in the morning on 4/11 where she started to feel weak and presyncopal.  She was also noted to have some issues with her speech becoming softer and possible facial droop on the right side.  Unfortunately, the daughters who are at the bedside cannot agree as to the specific course of symptomatology and the patient actually denies much of the symptoms aside from the specific left arm sensory deficit during the shower which is what prompted the emergency room visit.  She denies any tingling, or weakness in her extremities and has had no slurring of her speech.  She was noted to have elevated blood pressure as well yesterday evening with systolic in the 193 range.  She has notably had some poor blood pressure control recently for which her doctor increased labetalol dosing 3 months ago.  She states that ever since then, she has been noted to have some weakness and dizziness and was actually noted to have heart rate in the 40-50 bpm range. She denies any headache, visual changes, fever, or chills. There is no report of involuntary movement, loss of consciousness, or incontinence.   ED Course: Vital signs are stable, but she is noted to be somewhat bradycardic with heart rate in the 50-60 bpm range.  Additionally she is noted to have blood pressure that is elevated with systolic in the 790 range.  She is actually feeling back to her usual baseline and denies any current complaints.  Tele-neurology evaluation has been  performed with noted NIHSS score of 0 and no significant deficits identified.  Daughters at bedside agree that she is back to her usual baseline.  Recommendations have been made to start aspirin and repeat head CT in a.m. since MRI is not available.  Head CT with no acute findings currently laboratory data is otherwise unremarkable aside from some anemia with hemoglobin of 10.  EKG with noted sinus bradycardia at 54 bpm.  Review of Systems: All others reviewed and otherwise negative.  Past Medical History:  Diagnosis Date  . Arthritis   . Cancer ([place])   . Gout   . Hypertension   . Renal disorder   . Thyroid disease     Past Surgical History:  Procedure Laterality Date  . ABDOMINAL HYSTERECTOMY    . CHOLECYSTECTOMY    . THYROIDECTOMY       reports that she has never smoked. She has never used smokeless tobacco. She reports that she does not drink alcohol or use drugs.  Allergies  Allergen Reactions  . Ampicillin Rash    History reviewed. No pertinent family history.  Prior to Admission medications   Medication Sig Start Date End Date Taking? Authorizing Provider  Cholecalciferol (VITAMIN D3) 1000 units CAPS Take by mouth.   Yes [provider]  furosemide (LASIX) 20 MG tablet Take 20 mg by mouth daily as needed. 11/06/13  Yes [provider]  labetalol (NORMODYNE) 300 MG tablet Take 300 mg by mouth 2 (two) times daily. 03/14/17  Yes [provider]  levothyroxine (SYNTHROID, LEVOTHROID) 112 MCG tablet Take 112 mcg by mouth daily before breakfast.   Yes [provider]  Omega-3 Fatty Acids (FISH OIL PO) Take 1 capsule by mouth daily.   Yes [provider]  allopurinol (ZYLOPRIM) 100 MG tablet Take 200 mg by mouth daily. 03/22/17   [provider]  amLODipine (NORVASC) 10 MG tablet Take 10 mg by mouth daily. 12/01/13   [provider]  HYDROcodone-acetaminophen (NORCO) 5-325 MG tablet Take 1-2 tablets by mouth every 4  (four) hours as needed. 05/29/16   Elnora Morrison, MD  HYDROcodone-acetaminophen (NORCO/VICODIN) 5-325 MG tablet Take 1-2 tablets by mouth every 6 (six) hours as needed. Patient not taking: Reported on 05/29/2016 01/20/16   Fredia Sorrow, MD  HYDROcodone-acetaminophen (NORCO/VICODIN) 5-325 MG tablet Take 2 tablets by mouth every 4 (four) hours as needed. 05/29/16   Isla Pence, MD  lisinopril (PRINIVIL,ZESTRIL) 2.5 MG tablet Take 2.5 mg by mouth daily.    [provider]  predniSONE (DELTASONE) 10 MG tablet Take 4 tablets (40 mg total) by mouth daily. Patient not taking: Reported on 05/29/2016 01/20/16   Fredia Sorrow, MD  predniSONE (DELTASONE) 20 MG tablet 2 tabs po daily x 4 days 05/29/16   Elnora Morrison, MD    Physical Exam: Vitals:   04/14/17 0530 04/14/17 0600 04/14/17 0630 04/14/17 0700  BP: (!) 182/63 (!) 192/61 (!) 191/57 (!) 187/55  Pulse: (!) 50 (!) 52 (!) 50 (!) 48  Resp: 12 10 15 12   Temp:      TempSrc:      SpO2: 99% 100% 100% 100%  Weight:      Height:        Constitutional: NAD, calm, comfortable Vitals:   04/14/17 0530 04/14/17 0600 04/14/17 0630 04/14/17 0700  BP: (!) 182/63 (!) 192/61 (!) 191/57 (!) 187/55  Pulse: (!) 50 (!) 52 (!) 50 (!) 48  Resp: 12 10 15 12   Temp:      TempSrc:      SpO2: 99% 100% 100% 100%  Weight:      Height:       Eyes: lids and conjunctivae normal ENMT: Mucous membranes are moist.  Neck: normal, supple Respiratory: clear to auscultation bilaterally. Normal respiratory effort. No accessory muscle use.  Cardiovascular: Regular rate and rhythm, no murmurs.  Chronic lower extremity edema noted bilaterally. Abdomen: no tenderness, no distention. Bowel sounds positive.  Musculoskeletal:  No joint deformity upper and lower extremities.  No focal deficits identified on motor or sensory exam in all 4 extremities.  Cranial nerve examination appears to be intact. Skin: no rashes, lesions, ulcers.  Psychiatric: Normal judgment  and insight. Alert and oriented x 3. Normal mood.   Labs on Admission: I have personally reviewed following labs and imaging studies  CBC: Recent Labs  Lab 04/14/17 0315  WBC 6.0  NEUTROABS 3.7  HGB 10.3*  HCT 31.8*  MCV 96.7  PLT 494   Basic Metabolic Panel: Recent Labs  Lab 04/14/17 0315  NA 142  K 3.5  CL 103  CO2 25  GLUCOSE 163*  BUN 28*  CREATININE 1.30*  CALCIUM 9.5   GFR: Estimated Creatinine Clearance: 39.3 mL/min (A) (by C-G formula based on SCr of 1.3 mg/dL (H)). Liver Function Tests: Recent Labs  Lab 04/14/17 0315  AST 13*  ALT 11*  ALKPHOS 86  BILITOT 0.5  PROT 7.0  ALBUMIN 3.7   No results for input(s): LIPASE, AMYLASE in the last  168 hours. No results for input(s): AMMONIA in the last 168 hours. Coagulation Profile: Recent Labs  Lab 04/14/17 0315  INR 0.91   Cardiac Enzymes: Recent Labs  Lab 04/14/17 0315  TROPONINI <0.03   BNP (last 3 results) No results for input(s): PROBNP in the last 8760 hours. HbA1C: No results for input(s): HGBA1C in the last 72 hours. CBG: No results for input(s): GLUCAP in the last 168 hours. Lipid Profile: No results for input(s): CHOL, HDL, LDLCALC, TRIG, CHOLHDL, LDLDIRECT in the last 72 hours. Thyroid Function Tests: No results for input(s): TSH, T4TOTAL, FREET4, T3FREE, THYROIDAB in the last 72 hours. Anemia Panel: No results for input(s): VITAMINB12, FOLATE, FERRITIN, TIBC, IRON, RETICCTPCT in the last 72 hours. Urine analysis:    Component Value Date/Time   COLORURINE STRAW (A) 04/14/2017 0332   APPEARANCEUR CLEAR 04/14/2017 0332   LABSPEC 1.006 04/14/2017 0332   PHURINE 6.0 04/14/2017 0332   GLUCOSEU NEGATIVE 04/14/2017 0332   HGBUR NEGATIVE 04/14/2017 0332   BILIRUBINUR NEGATIVE 04/14/2017 0332   KETONESUR NEGATIVE 04/14/2017 0332   PROTEINUR NEGATIVE 04/14/2017 0332   NITRITE NEGATIVE 04/14/2017 0332   LEUKOCYTESUR NEGATIVE 04/14/2017 0332    Radiological Exams on Admission: Ct Head  Wo Contrast  Result Date: 04/14/2017 CLINICAL DATA:  Generalized weakness. EXAM: CT HEAD WITHOUT CONTRAST TECHNIQUE: Contiguous axial images were obtained from the base of the skull through the vertex without intravenous contrast. COMPARISON:  None. FINDINGS: Brain: Brain volume is normal for age. No intracranial hemorrhage, mass effect, or midline shift. No hydrocephalus. The basilar cisterns are patent. No evidence of territorial infarct or acute ischemia. No extra-axial or intracranial fluid collection. Vascular: Atherosclerosis of skullbase vasculature without hyperdense vessel or abnormal calcification. Skull: No fracture or focal lesion. Sinuses/Orbits: Paranasal sinuses and mastoid air cells are clear. The visualized orbits are unremarkable. Other: None. IMPRESSION: Normal noncontrast head CT for age. Electronically Signed   By: Jeb Levering M.D.   On: 04/14/2017 04:00    EKG: Independently reviewed.  Sinus bradycardia with 54 bpm.  Assessment/Plan Principal Problem:   TIA (transient ischemic attack) Active Problems:   Essential hypertension   Dyslipidemia    1. TIA versus CVA.  I suspect this is more TIA since she has had very quick symptomatic resolution and perhaps much of her symptoms may also be related to elevated blood pressure readings and bradycardia with recent increase in labetalol dosing.  Will perform full evaluation with carotid ultrasound, 2D echocardiogram, and repeat CT of the head in a.m. as recommended by tele-neurology.  Maintain on aspirin/atorvastatin and check fasting lipid panel as well as TSH and hemoglobin A1c.  Maintain on fall precautions with PT evaluation.  Start diet after stroke swallow evaluation.  Check orthostatic vitals. 2. Uncontrolled hypertension with bradycardia.  Monitor on telemetry and hold labetalol.  Maintain on oral hydralazine with IV pushes.  Repeat EKG in a.m. 3. Chronic diastolic CHF.  She appears to be euvolemic.  Last echo 08/2015 with  preserved EF and grade 1 diastolic dysfunction noted along with LVH.  Continue on home Lasix prn and monitor daily weights.  Avoid IV fluid at this time. 4. Prior history of thyroid cancer with thyroidectomy.  Maintain on Synthroid. 5. Gout.  Maintain on allopurinol.   DVT prophylaxis: Lovenox Code Status: Full Family Communication: 2 daughters at bedside Disposition Plan: Plan for TIA/CVA workup with repeat head CT in a.m. Consults called: None, tele-neurology performed in ED. Admission status: Observation, telemetry   Joushua Dugar D Manuella Ghazi  DO [facility] Pager [phone]  If 7PM-7AM, please contact night-coverage [email or web address] Password [email or web address]  04/14/2017, 7:34 AM

## 2017-04-14 NOTE — ED Triage Notes (Signed)
Pt arrives from home stating her BP at home was 205/80 and she feels bad.

## 2017-04-14 NOTE — Evaluation (Signed)
Physical Therapy Evaluation Patient Details Name: Amber Terry MRN: [id number] DOB: [birth year] Today's Date: 04/14/2017   History of Present Illness  Amber Terry is a 82 y/o female with c/o lack of sensation LUE, altered mentation, presyncopal, issues with speech and possible right sided facial droop.  PMhx: HTN, dyslipidemia, thyroid cancer, gout    Clinical Impression  Patient functioning near baseline for functional mobility and gait, demonstrates labored movement with some assist for sitting up at side of gurney, able to ambulate in hallway without loss of balance or c/o symptoms.  Patient will benefit from continued physical therapy in hospital and recommended venue below to increase strength, balance, endurance for safe ADLs and gait.     Follow Up Recommendations No PT follow up;Supervision - Intermittent    Equipment Recommendations       Recommendations for Other Services       Precautions / Restrictions Precautions Precautions: Fall Restrictions Weight Bearing Restrictions: No      Mobility  Bed Mobility Overal bed mobility: Needs Assistance Bed Mobility: Supine to Sit;Sit to Supine     Supine to sit: Min assist Sit to supine: Min guard   General bed mobility comments: labored slow movement for sitting up  Transfers Overall transfer level: Needs assistance Equipment used: Rolling walker (2 wheeled) Transfers: Sit to/from [facility] Sit to Stand: Supervision Stand pivot transfers: Supervision       General transfer comment: slightly labored movement  Ambulation/Gait Ambulation/Gait assistance: Supervision Ambulation Distance (Feet): 75 Feet Assistive device: Rolling walker (2 wheeled) Gait Pattern/deviations: Decreased step length - right;Decreased step length - left;Decreased stride length Gait velocity: (decreased)   General Gait Details: slightly labored slow cadence without loss of balance, required verbal cues initially to push  RW instead of using like a pickup walker  Stairs            Wheelchair Mobility    Modified Rankin (Stroke Patients Only)       Balance Overall balance assessment: Needs assistance Sitting-balance support: No upper extremity supported;Feet supported Sitting balance-Leahy Scale: Good     Standing balance support: Bilateral upper extremity supported;During functional activity Standing balance-Leahy Scale: Fair Standing balance comment: using RW                             Pertinent Vitals/Pain Pain Assessment: No/denies pain    Home Living Family/patient expects to be discharged to:: Private residence Living Arrangements: Alone Available Help at Discharge: Family Type of Home: House Home Access: Level entry     Home Layout: One level Home Equipment: Environmental consultant - 2 wheels;Cane - single point;Bedside commode;Toilet riser      Prior Function Level of Independence: Independent with assistive device(s)         Comments: household and community ambulation with RW     Hand Dominance   Dominant Hand: Right    Extremity/Trunk Assessment   Upper Extremity Assessment Upper Extremity Assessment: Defer to OT evaluation    Lower Extremity Assessment Lower Extremity Assessment: Overall WFL for tasks assessed    Cervical / Trunk Assessment Cervical / Trunk Assessment: Kyphotic  Communication   Communication: No difficulties;HOH  Cognition Arousal/Alertness: Awake/alert Behavior During Therapy: WFL for tasks assessed/performed Overall Cognitive Status: Within Functional Limits for tasks assessed  General Comments      Exercises     Assessment/Plan    PT Assessment Patient needs continued PT services  PT Problem List Decreased strength;Decreased balance;Decreased mobility       PT Treatment Interventions Gait training;Stair training;Functional mobility training;Therapeutic  activities;Therapeutic exercise;Patient/family education    PT Goals (Current goals can be found in the Care Plan section)  Acute Rehab PT Goals Patient Stated Goal: return home with family to assist PT Goal Formulation: With patient/family Time For Goal Achievement: 04/16/17 Potential to Achieve Goals: Good    Frequency 7X/week   Barriers to discharge        Co-evaluation               AM-PAC PT "6 Clicks" Daily Activity  Outcome Measure Difficulty turning over in bed (including adjusting bedclothes, sheets and blankets)?: A Little Difficulty moving from lying on back to sitting on the side of the bed? : A Little Difficulty sitting down on and standing up from a chair with arms (e.g., wheelchair, bedside commode, etc,.)?: None Help needed moving to and from a bed to chair (including a wheelchair)?: None Help needed walking in hospital room?: A Little Help needed climbing 3-5 steps with a railing? : A Little 6 Click Score: 20    End of Session Equipment Utilized During Treatment: Gait belt Activity Tolerance: Patient tolerated treatment well;Patient limited by fatigue Patient left: in bed;with family/visitor present Nurse Communication: Mobility status      Time: 0821-0853 PT Time Calculation (min) (ACUTE ONLY): 32 min   Charges:   PT Evaluation $PT Eval Moderate Complexity: 1 Mod PT Treatments $Therapeutic Activity: 23-37 mins   PT G Codes:        10:32 AM, 2017/05/02 Lonell Grandchild, MPT Physical Therapist with [facility] 336 [phone] office [phone] mobile phone

## 2017-04-14 NOTE — Progress Notes (Signed)
Patient passed RN swallow screen in ED prior to admission. Heart healthy diet ordered as transcribed. Patient eating, chewing w/o difficulty. Denies any problems with swallowing at this time. Patient able to take PO meds whole w/o difficulty at this time. No coughing noted while eating and/or drinking at this time.

## 2017-04-14 NOTE — ED Notes (Signed)
Tele/Neuro Consult at bedside

## 2017-04-14 NOTE — ED Provider Notes (Signed)
[facility] EMERGENCY DEPARTMENT Provider Note   CSN: [id number] Arrival date & time: 04/14/17  0128  Time seen 02:45 AM   History   Chief Complaint Chief Complaint  Patient presents with  . Hypertension    HPI Amber Terry is a 82 y.o. female.  HPI patient states she woke up at 3 AM on April 11 and had to use the bathroom.  She states she was on the commode and suddenly "felt crazy".  She states she felt weak like she was going to pass out and that lasted about 5 or 10 minutes.  She denies any pain, diaphoresis, nausea, or vomiting.  She states she chronically feels weak.  She states later that day she did not "feel too bad".  Her family states she did not quite seem her usual self during the day on April 12 but cannot tell me specifically how she was different.  Patient states this evening she took a shower and when the water landed on her right arm it felt warm.  When the water did not run on her arm it did not feel that way.  She denies any tingling or weakness in that arm.  She states she is normally right-handed.  She states she called her  daughter when she got out of the shower and the daughter said her speech was slow and soft but not mumbling or slurring.  She states when she got there the patient was "hollering".  Patient states however when she stands up she has pain in the knee and that is chronic and she hollars when she stands up.  Daughter also feels like the right side of her face is drooped.  Daughter states when she checked her blood pressure tonight it was 250 systolic and her heart rate was 44.  When she saw her doctor about 3 months ago they increased her amlodipine dose.  PCP Redmond School, MD   Past Medical History:  Diagnosis Date  . Arthritis   . Cancer ([place])   . Gout   . Hypertension   . Renal disorder   . Thyroid disease     There are no active problems to display for this patient.   Past Surgical History:  Procedure Laterality Date  . ABDOMINAL  HYSTERECTOMY    . CHOLECYSTECTOMY    . THYROIDECTOMY       OB History   None      Home Medications    Prior to Admission medications   Medication Sig Start Date End Date Taking? Authorizing Provider  amLODipine (NORVASC) 10 MG tablet Take 10 mg by mouth daily. 12/01/13  Yes [provider]  Cholecalciferol (VITAMIN D3) 1000 units CAPS Take by mouth.   Yes [provider]  furosemide (LASIX) 20 MG tablet Take 20 mg by mouth daily as needed. 11/06/13  Yes [provider]  levothyroxine (SYNTHROID, LEVOTHROID) 112 MCG tablet Take 112 mcg by mouth daily before breakfast.   Yes [provider]  Omega-3 Fatty Acids (FISH OIL PO) Take 1 capsule by mouth daily.   Yes [provider]  HYDROcodone-acetaminophen (NORCO) 5-325 MG tablet Take 1-2 tablets by mouth every 4 (four) hours as needed. 05/29/16   Elnora Morrison, MD  HYDROcodone-acetaminophen (NORCO/VICODIN) 5-325 MG tablet Take 1-2 tablets by mouth every 6 (six) hours as needed. Patient not taking: Reported on 05/29/2016 01/20/16   Fredia Sorrow, MD  HYDROcodone-acetaminophen (NORCO/VICODIN) 5-325 MG tablet Take 2 tablets by mouth every 4 (four) hours as  needed. 05/29/16   Isla Pence, MD  lisinopril (PRINIVIL,ZESTRIL) 2.5 MG tablet Take 2.5 mg by mouth daily.    [provider]  predniSONE (DELTASONE) 10 MG tablet Take 4 tablets (40 mg total) by mouth daily. Patient not taking: Reported on 05/29/2016 01/20/16   Fredia Sorrow, MD  predniSONE (DELTASONE) 20 MG tablet 2 tabs po daily x 4 days 05/29/16   Elnora Morrison, MD    Family History History reviewed. No pertinent family history.  Social History Social History   Tobacco Use  . Smoking status: Never Smoker  . Smokeless tobacco: Never Used  Substance Use Topics  . Alcohol use: No  . Drug use: No  lives alone   Allergies   Ampicillin   Review of Systems Review of Systems  All other systems reviewed and are  negative.    Physical Exam Updated Vital Signs BP (!) 187/53   Pulse (!) 53   Temp 97.6 F (36.4 C) (Oral)   Resp (!) 9   Ht 5\' 2"  (1.575 m)   Wt 102.1 kg (225 lb)   SpO2 100%   BMI 41.15 kg/m   Vital signs normal except hypertension, bradycardia   Physical Exam  Constitutional: She is oriented to person, place, and time. She appears well-developed and well-nourished.  Non-toxic appearance. She does not appear ill. No distress.  Patient is hard of hearing  HENT:  Head: Normocephalic and atraumatic.  Right Ear: External ear normal.  Left Ear: External ear normal.  Nose: Nose normal. No mucosal edema or rhinorrhea.  Mouth/Throat: Oropharynx is clear and moist and mucous membranes are normal. No dental abscesses or uvula swelling.  Eyes: Pupils are equal, round, and reactive to light. Conjunctivae and EOM are normal.  Neck: Normal range of motion and full passive range of motion without pain. Neck supple.  No carotid bruits  Cardiovascular: Normal rate, regular rhythm and normal heart sounds. Exam reveals no gallop and no friction rub.  No murmur heard. Pulmonary/Chest: Effort normal and breath sounds normal. No respiratory distress. She has no wheezes. She has no rhonchi. She has no rales. She exhibits no tenderness and no crepitus.  Abdominal: Soft. Normal appearance and bowel sounds are normal. She exhibits no distension. There is no tenderness. There is no rebound and no guarding.  Musculoskeletal: Normal range of motion. She exhibits no edema or tenderness.  Moves all extremities well.   Neurological: She is alert and oriented to person, place, and time. She has normal strength. No cranial nerve deficit.  Grips are equal, no pronator drift, no lower extremity weakness  Skin: Skin is warm, dry and intact. No rash noted. No erythema. No pallor.  Psychiatric: She has a normal mood and affect. Her speech is normal and behavior is normal. Her mood appears not anxious.  Nursing  note and vitals reviewed.    ED Treatments / Results  Labs (all labs ordered are listed, but only abnormal results are displayed) Results for orders placed or performed during the hospital encounter of 04/14/17  Comprehensive metabolic panel  Result Value Ref Range   Sodium 142 135 - 145 mmol/L   Potassium 3.5 3.5 - 5.1 mmol/L   Chloride 103 101 - 111 mmol/L   CO2 25 22 - 32 mmol/L   Glucose, Bld 163 (H) 65 - 99 mg/dL   BUN 28 (H) 6 - 20 mg/dL   Creatinine, Ser 1.30 (H) 0.44 - 1.00 mg/dL   Calcium 9.5 8.9 - 10.3 mg/dL  Total Protein 7.0 6.5 - 8.1 g/dL   Albumin 3.7 3.5 - 5.0 g/dL   AST 13 (L) 15 - 41 U/L   ALT 11 (L) 14 - 54 U/L   Alkaline Phosphatase 86 38 - 126 U/L   Total Bilirubin 0.5 0.3 - 1.2 mg/dL   GFR calc non Af Amer 38 (L) >60 mL/min   GFR calc Af Amer 44 (L) >60 mL/min   Anion gap 14 5 - 15  Troponin I  Result Value Ref Range   Troponin I <0.03 <0.03 ng/mL  CBC with Differential  Result Value Ref Range   WBC 6.0 4.0 - 10.5 K/uL   RBC 3.29 (L) 3.87 - 5.11 MIL/uL   Hemoglobin 10.3 (L) 12.0 - 15.0 g/dL   HCT 31.8 (L) 36.0 - 46.0 %   MCV 96.7 78.0 - 100.0 fL   MCH 31.3 26.0 - 34.0 pg   MCHC 32.4 30.0 - 36.0 g/dL   RDW 13.8 11.5 - 15.5 %   Platelets 176 150 - 400 K/uL   Neutrophils Relative % 61 %   Neutro Abs 3.7 1.7 - 7.7 K/uL   Lymphocytes Relative 29 %   Lymphs Abs 1.8 0.7 - 4.0 K/uL   Monocytes Relative 8 %   Monocytes Absolute 0.5 0.1 - 1.0 K/uL   Eosinophils Relative 2 %   Eosinophils Absolute 0.1 0.0 - 0.7 K/uL   Basophils Relative 0 %   Basophils Absolute 0.0 0.0 - 0.1 K/uL  Protime-INR  Result Value Ref Range   Prothrombin Time 12.1 11.4 - 15.2 seconds   INR 0.91   APTT  Result Value Ref Range   aPTT 30 24 - 36 seconds  Urinalysis, Routine w reflex microscopic  Result Value Ref Range   Color, Urine STRAW (A) YELLOW   APPearance CLEAR CLEAR   Specific Gravity, Urine 1.006 1.005 - 1.030   pH 6.0 5.0 - 8.0   Glucose, UA NEGATIVE NEGATIVE  mg/dL   Hgb urine dipstick NEGATIVE NEGATIVE   Bilirubin Urine NEGATIVE NEGATIVE   Ketones, ur NEGATIVE NEGATIVE mg/dL   Protein, ur NEGATIVE NEGATIVE mg/dL   Nitrite NEGATIVE NEGATIVE   Leukocytes, UA NEGATIVE NEGATIVE   Laboratory interpretation all normal except anemia    EKG EKG Interpretation  Date/Time:  Saturday April 14 2017 01:45:21 EDT Ventricular Rate:  54 PR Interval:    QRS Duration: 105 QT Interval:  489 QTC Calculation: 464 R Axis:   -11 Text Interpretation:  Sinus bradycardia Abnormal R-wave progression, early transition Borderline repolarization abnormality Baseline wander in lead(s) II III aVF Since last tracing rate slower 20 Apr 2009 Confirmed by Rolland Porter [phone]) on 04/14/2017 2:42:09 AM   Radiology Ct Head Wo Contrast  Result Date: 04/14/2017 CLINICAL DATA:  Generalized weakness. EXAM: CT HEAD WITHOUT CONTRAST TECHNIQUE: Contiguous axial images were obtained from the base of the skull through the vertex without intravenous contrast. COMPARISON:  None. FINDINGS: Brain: Brain volume is normal for age. No intracranial hemorrhage, mass effect, or midline shift. No hydrocephalus. The basilar cisterns are patent. No evidence of territorial infarct or acute ischemia. No extra-axial or intracranial fluid collection. Vascular: Atherosclerosis of skullbase vasculature without hyperdense vessel or abnormal calcification. Skull: No fracture or focal lesion. Sinuses/Orbits: Paranasal sinuses and mastoid air cells are clear. The visualized orbits are unremarkable. Other: None. IMPRESSION: Normal noncontrast head CT for age. Electronically Signed   By: Jeb Levering M.D.   On: 04/14/2017 04:00    Procedures Procedures (including critical  care time)  Medications Ordered in ED Medications - No data to display   Initial Impression / Assessment and Plan / ED Course  I have reviewed the triage vital signs and the nursing notes.  Pertinent labs & imaging results that were  available during my care of the patient were reviewed by me and considered in my medical decision making (see chart for details).     Laboratory testing and CT of the head was ordered.  It is not clear to me whether the patient may have had a TIA or what happened tonight.  4:20 AM I gave her test results to her family.  Patient does have a new anemia although it is not low enough to require transfusion.  We discussed that she be getting a neurology consult.  I got 3 phone calls from tele-neurology stating they had already evaluated the patient but they have not per family and no one has talked to me about the patient.  Finally at 5:20 AM I spoke to the doctor and she states she will evaluate the patient, after telling me the 3rd time that patient had already been seen or discussed.  I spoke to the tone neurologist at 6:25 AM.  She recommends admission for TIA evaluation.  She states if MRI is unavailable to repeat her CT scan later this evening or tomorrow morning.  She also recommends aspirin if not contraindicated.  06:45 AM Dr Olevia Bowens, hospitalist, wants the day shift to evaluate patient and decide if she should stay here to be admitted and get repeat CT scan or go to [facility] to have MR done. Pt has claustrophobia, so MRI may be difficult.  Final Clinical Impressions(s) / ED Diagnoses   Final diagnoses:  TIA (transient ischemic attack)    Plan admission  Rolland Porter, MD, Barbette Or, MD 04/14/17 [phone]

## 2017-04-14 NOTE — ED Notes (Signed)
ED Provider at bedside. 

## 2017-04-14 NOTE — Plan of Care (Signed)
  Problem: Acute Rehab PT Goals(only PT should resolve) Goal: Pt Will Go Supine/Side To Sit Outcome: Progressing Flowsheets (Taken 04/14/2017 1033) Pt will go Supine/Side to Sit: with supervision Goal: Patient Will Transfer Sit To/From Stand Outcome: Progressing Flowsheets (Taken 04/14/2017 1033) Patient will transfer sit to/from stand: with modified independence Goal: Pt Will Transfer Bed To Chair/Chair To Bed Outcome: Progressing Flowsheets (Taken 04/14/2017 1033) Pt will Transfer Bed to Chair/Chair to Bed: with modified independence Goal: Pt Will Ambulate Outcome: Progressing Flowsheets (Taken 04/14/2017 1033) Pt will Ambulate: 100 feet;with modified independence  10:34 AM, 04/14/17 Lonell Grandchild, MPT Physical Therapist with [facility] [phone] office [phone] mobile phone

## 2017-04-15 ENCOUNTER — Observation Stay (HOSPITAL_COMMUNITY): Payer: PPO

## 2017-04-15 DIAGNOSIS — R7309 Other abnormal glucose: Secondary | ICD-10-CM | POA: Diagnosis not present

## 2017-04-15 DIAGNOSIS — Z7982 Long term (current) use of aspirin: Secondary | ICD-10-CM | POA: Diagnosis not present

## 2017-04-15 DIAGNOSIS — I5032 Chronic diastolic (congestive) heart failure: Secondary | ICD-10-CM | POA: Diagnosis not present

## 2017-04-15 DIAGNOSIS — Z85850 Personal history of malignant neoplasm of thyroid: Secondary | ICD-10-CM | POA: Diagnosis not present

## 2017-04-15 DIAGNOSIS — E785 Hyperlipidemia, unspecified: Secondary | ICD-10-CM | POA: Diagnosis not present

## 2017-04-15 DIAGNOSIS — G459 Transient cerebral ischemic attack, unspecified: Secondary | ICD-10-CM | POA: Diagnosis not present

## 2017-04-15 DIAGNOSIS — M109 Gout, unspecified: Secondary | ICD-10-CM | POA: Diagnosis not present

## 2017-04-15 DIAGNOSIS — Z79899 Other long term (current) drug therapy: Secondary | ICD-10-CM | POA: Diagnosis not present

## 2017-04-15 DIAGNOSIS — I110 Hypertensive heart disease with heart failure: Secondary | ICD-10-CM | POA: Diagnosis not present

## 2017-04-15 DIAGNOSIS — E782 Mixed hyperlipidemia: Secondary | ICD-10-CM | POA: Diagnosis not present

## 2017-04-15 LAB — LIPID PANEL
Cholesterol: 317 mg/dL — ABNORMAL HIGH (ref 0–200)
HDL: 66 mg/dL (ref >40)
LDL Cholesterol: 221 mg/dL — ABNORMAL HIGH (ref 0–99)
Total CHOL/HDL Ratio: 4.8 RATIO
Triglycerides: 152 mg/dL — ABNORMAL HIGH (ref <150)
VLDL: 30 mg/dL (ref 0–40)

## 2017-04-15 LAB — HEMOGLOBIN A1C
HEMOGLOBIN A1C: 6.3 % — AB (ref 4.8–5.6)
MEAN PLASMA GLUCOSE: 134.11 mg/dL

## 2017-04-15 LAB — CBC
HCT: 32 % — ABNORMAL LOW (ref 36.0–46.0)
Hemoglobin: 10.1 g/dL — ABNORMAL LOW (ref 12.0–15.0)
MCH: 30.9 pg (ref 26.0–34.0)
MCHC: 31.6 g/dL (ref 30.0–36.0)
MCV: 97.9 fL (ref 78.0–100.0)
Platelets: 168 10*3/uL (ref 150–400)
RBC: 3.27 MIL/uL — ABNORMAL LOW (ref 3.87–5.11)
RDW: 14.2 % (ref 11.5–15.5)
WBC: 5.3 10*3/uL (ref 4.0–10.5)

## 2017-04-15 LAB — BASIC METABOLIC PANEL
Anion gap: 10 (ref 5–15)
BUN: 28 mg/dL — ABNORMAL HIGH (ref 6–20)
CHLORIDE: 103 mmol/L (ref 101–111)
CO2: 27 mmol/L (ref 22–32)
Calcium: 8.8 mg/dL — ABNORMAL LOW (ref 8.9–10.3)
Creatinine, Ser: 1.22 mg/dL — ABNORMAL HIGH (ref 0.44–1.00)
GFR calc non Af Amer: 41 mL/min — ABNORMAL LOW (ref >60)
GFR, EST AFRICAN AMERICAN: 48 mL/min — AB (ref >60)
Glucose, Bld: 121 mg/dL — ABNORMAL HIGH (ref 65–99)
POTASSIUM: 3.5 mmol/L (ref 3.5–5.1)
SODIUM: 140 mmol/L (ref 135–145)

## 2017-04-15 MED ORDER — ATORVASTATIN CALCIUM 80 MG PO TABS: 40.0000 mg | ORAL_TABLET | Freq: Every day | ORAL | 3 refills | Status: DC

## 2017-04-15 MED ORDER — LISINOPRIL 10 MG PO TABS: 10.0000 mg | ORAL_TABLET | Freq: Every day | ORAL | 3 refills | Status: DC

## 2017-04-15 MED ORDER — ASPIRIN EC 81 MG PO TBEC: 162.0000 mg | DELAYED_RELEASE_TABLET | Freq: Every day | ORAL | 0 refills | Status: AC

## 2017-04-15 NOTE — Progress Notes (Signed)
Physical Therapy Treatment Patient Details Name: Amber Terry MRN: [id number] DOB: 1935/11/25 Today's Date: 04/15/2017    History of Present Illness Amber Terry is a 82 y/o female with c/o lack of sensation LUE, altered mentation, presyncopal, issues with speech and possible right sided facial droop.  PMhx: HTN, dyslipidemia, thyroid cancer, gout    PT Comments    Patient requires less assistance for supine to sit, c/o right knee discomfort upon standing which is usual per patient, "especially when it rains", able to ambulate in hallway with slow slightly labored cadence without loss of balance and tolerated sitting up in chair with her daughter present after therapy.  Patient will benefit from continued physical therapy in hospital and recommended venue below to increase strength, balance, endurance for safe ADLs and gait.    Follow Up Recommendations  Home health PT;Supervision - Intermittent     Equipment Recommendations  None recommended by PT    Recommendations for Other Services       Precautions / Restrictions Precautions Precautions: Fall Restrictions Weight Bearing Restrictions: No    Mobility  Bed Mobility Overal bed mobility: Needs Assistance Bed Mobility: Supine to Sit     Supine to sit: Supervision     General bed mobility comments: uses siderail with head of bed raised  Transfers Overall transfer level: Needs assistance Equipment used: Rolling walker (2 wheeled) Transfers: Sit to/from [facility] Sit to Stand: Supervision Stand pivot transfers: Supervision          Ambulation/Gait Ambulation/Gait assistance: Supervision Ambulation Distance (Feet): 90 Feet Assistive device: Rolling walker (2 wheeled) Gait Pattern/deviations: Decreased step length - right;Decreased step length - left;Decreased stance time - right;Decreased stride length Gait velocity: decreased   General Gait Details: demonstrates slow slightly labored cadence, c/o  right knee discomfort, no loss of balance, limited secondary to c/o fatigue   Stairs             Wheelchair Mobility    Modified Rankin (Stroke Patients Only)       Balance Overall balance assessment: Needs assistance Sitting-balance support: Feet supported;No upper extremity supported Sitting balance-Leahy Scale: Good     Standing balance support: During functional activity;Bilateral upper extremity supported Standing balance-Leahy Scale: Fair Standing balance comment: using RW                            Cognition Arousal/Alertness: Awake/alert Behavior During Therapy: WFL for tasks assessed/performed Overall Cognitive Status: Within Functional Limits for tasks assessed                                        Exercises      General Comments        Pertinent Vitals/Pain Pain Assessment: Faces Faces Pain Scale: Hurts a little bit Pain Location: right knee when standing, possible arthritic pain per patient Pain Descriptors / Indicators: Grimacing;Aching Pain Intervention(s): Limited activity within patient's tolerance    Home Living                      Prior Function            PT Goals (current goals can now be found in the care plan section) Acute Rehab PT Goals Patient Stated Goal: return home with family to assist PT Goal Formulation: With patient/family Time For Goal Achievement: 04/16/17 Potential  to Achieve Goals: Good Progress towards PT goals: Progressing toward goals    Frequency    7X/week      PT Plan Current plan remains appropriate    Co-evaluation              AM-PAC PT "6 Clicks" Daily Activity  Outcome Measure  Difficulty turning over in bed (including adjusting bedclothes, sheets and blankets)?: None Difficulty moving from lying on back to sitting on the side of the bed? : None Difficulty sitting down on and standing up from a chair with arms (e.g., wheelchair, bedside commode,  etc,.)?: None Help needed moving to and from a bed to chair (including a wheelchair)?: A Little Help needed walking in hospital room?: A Little Help needed climbing 3-5 steps with a railing? : A Little 6 Click Score: 21    End of Session Equipment Utilized During Treatment: Gait belt Activity Tolerance: Patient tolerated treatment well;Patient limited by fatigue Patient left: in chair;with call bell/phone within reach;with family/visitor present Nurse Communication: Mobility status PT Visit Diagnosis: Unsteadiness on feet (R26.81);Other abnormalities of gait and mobility (R26.89);Muscle weakness (generalized) (M62.81)     Time: 5427-0623 PT Time Calculation (min) (ACUTE ONLY): 31 min  Charges:  $Gait Training: 8-22 mins $Therapeutic Activity: 8-22 mins                    G Codes:       10:19 AM, 13-May-2017 Lonell Grandchild, MPT Physical Therapist with [facility] [phone] office [phone] mobile phone

## 2017-04-15 NOTE — Discharge Summary (Signed)
Physician Discharge Summary  Amber Terry TIR:[id number] DOB: 17-Mar-1935 DOA: 04/14/2017  PCP: Redmond School, MD  Admit date: 04/14/2017  Discharge date: 04/15/2017  Admitted From:Home  Disposition:  Home  Recommendations for Outpatient Follow-up:  1. Follow up with PCP in 1-2 weeks  Home Health:Yes with PT  Equipment/Devices:None  Discharge Condition:Stable  CODE STATUS: Full  Diet recommendation: Heart Healthy  Brief/Interim Summary:  Amber Terry is a 82 y.o. female with medical history significant for hypertension, dyslipidemia, prior thyroid cancer, and gout who presented to the ED upon noticing that she could not feel hot water on her left arm while she was showering.  This is all in the setting of some altered mentation that began early in the morning on 4/11 where she started to feel weak and presyncopal.  She was also noted to have some issues with her speech becoming softer and possible facial droop on the right side.    Her initial head CT did not demonstrate any acute findings and per telemetry neurology recommendations patient was to undergo repeat head CT which she did on this day of discharge with once again, no acute findings.  She has no motor or sensory deficits and does not have any deficits with her speech or swallowing.  Her 2D echocardiogram does not demonstrate any acute findings at this time and her bilateral carotid ultrasound demonstrates some plaque with no hemodynamic restrictions.  It is possible that she may have had a CVA and will be discharged on aspirin 162 mg daily for now.  Her fasting lipid profile did demonstrate some dyslipidemia for which she will be started on atorvastatin 40 mg as well.  Finally, she was also noted to be bradycardic during this admission for which labetalol has been discontinued.  She is maintaining borderline elevated blood pressure readings without this medication but her bradycardia is currently improved.  I have increased her  lisinopril to 10 mg from 2.5 mg and she will need close follow-up to her primary care physician in 1-2 weeks for repeat check on her blood pressure.   Discharge Diagnoses:  Principal Problem:   TIA (transient ischemic attack) Active Problems:   Essential hypertension   Dyslipidemia  1. TIA versus CVA.  I suspect this is more TIA since she has had very quick symptomatic resolution and perhaps much of her symptoms may also be related to elevated blood pressure readings and bradycardia with recent increase in labetalol dosing.    Workup with repeat head CT as well as 2D echocardiogram and carotid ultrasounds with no acute findings.  Discharged with atorvastatin 40 mg daily as well as aspirin 162 mg daily.  She also requires home health physical therapy at this time. 2. Uncontrolled hypertension with bradycardia.    Labetalol discontinued with lisinopril increased to 10 mg. 3. Chronic diastolic CHF.    Currently euvolemic with no changes to echocardiogram.  Continue Lasix as needed. 4. Prior history of thyroid cancer with thyroidectomy.  Maintain on Synthroid. 5. Gout.  Maintain on allopurinol.     Discharge Instructions  Discharge Instructions    Diet - low sodium heart healthy   Complete by:  As directed    Increase activity slowly   Complete by:  As directed      Allergies as of 04/15/2017      Reactions   Ampicillin Rash      Medication List    STOP taking these medications   labetalol 300 MG tablet Commonly known as:  NORMODYNE     TAKE these medications   amLODipine 10 MG tablet Commonly known as:  NORVASC Take 10 mg by mouth daily.   aspirin EC 81 MG tablet Take 2 tablets (162 mg total) by mouth daily.   atorvastatin 80 MG tablet Commonly known as:  LIPITOR Take 0.5 tablets (40 mg total) by mouth daily at 6 PM.   FISH OIL PO Take 1 capsule by mouth daily.   furosemide 20 MG tablet Commonly known as:  LASIX Take 20 mg by mouth daily as needed.    HYDROcodone-acetaminophen 5-325 MG tablet Commonly known as:  NORCO/VICODIN Take 1-2 tablets by mouth every 6 (six) hours as needed.   levothyroxine 112 MCG tablet Commonly known as:  SYNTHROID, LEVOTHROID Take 112 mcg by mouth daily before breakfast.   lisinopril 10 MG tablet Commonly known as:  PRINIVIL,ZESTRIL Take 1 tablet (10 mg total) by mouth daily. What changed:    medication strength  how much to take   Vitamin D3 1000 units Caps Take by mouth.       Allergies  Allergen Reactions  . Ampicillin Rash    Consultations:  None   Procedures/Studies: Ct Head Wo Contrast  Result Date: 04/15/2017 CLINICAL DATA:  82 year old with TIA and generalized weakness. EXAM: CT HEAD WITHOUT CONTRAST TECHNIQUE: Contiguous axial images were obtained from the base of the skull through the vertex without intravenous contrast. COMPARISON:  04/14/2017 FINDINGS: Brain: No evidence of acute infarction, hemorrhage, hydrocephalus, extra-axial collection or mass lesion/mass effect. Vascular: No hyperdense vessel or unexpected calcification. Skull: Normal. Negative for fracture or focal lesion. Sinuses/Orbits: No acute finding. Other: None. IMPRESSION: Negative head CT. Electronically Signed   By: Markus Daft M.D.   On: 04/15/2017 11:29   Ct Head Wo Contrast  Result Date: 04/14/2017 CLINICAL DATA:  Generalized weakness. EXAM: CT HEAD WITHOUT CONTRAST TECHNIQUE: Contiguous axial images were obtained from the base of the skull through the vertex without intravenous contrast. COMPARISON:  None. FINDINGS: Brain: Brain volume is normal for age. No intracranial hemorrhage, mass effect, or midline shift. No hydrocephalus. The basilar cisterns are patent. No evidence of territorial infarct or acute ischemia. No extra-axial or intracranial fluid collection. Vascular: Atherosclerosis of skullbase vasculature without hyperdense vessel or abnormal calcification. Skull: No fracture or focal lesion.  Sinuses/Orbits: Paranasal sinuses and mastoid air cells are clear. The visualized orbits are unremarkable. Other: None. IMPRESSION: Normal noncontrast head CT for age. Electronically Signed   By: Jeb Levering M.D.   On: 04/14/2017 04:00   US Carotid Bilateral (at [facility] Only)  Result Date: 04/14/2017 CLINICAL DATA:  82 year old female with a history of TIA EXAM: BILATERAL CAROTID DUPLEX ULTRASOUND TECHNIQUE: Pearline Cables scale imaging, color Doppler and duplex ultrasound were performed of bilateral carotid and vertebral arteries in the neck. COMPARISON:  None. FINDINGS: Criteria: Quantification of carotid stenosis is based on velocity parameters that correlate the residual internal carotid diameter with NASCET-based stenosis levels, using the diameter of the distal internal carotid lumen as the denominator for stenosis measurement. The following velocity measurements were obtained: RIGHT ICA:  Systolic 970 cm/sec, Diastolic 28 cm/sec CCA:  95 cm/sec SYSTOLIC ICA/CCA RATIO:  1.6 ECA:  186 cm/sec LEFT ICA:  Systolic 263 cm/sec, Diastolic 28 cm/sec CCA:  94 cm/sec SYSTOLIC ICA/CCA RATIO:  1.5 ECA:  189 cm/sec Right Brachial SBP: Not acquired Left Brachial SBP: Not acquired RIGHT CAROTID ARTERY: No significant calcifications of the right common carotid artery. Intermediate waveform maintained. Moderate heterogeneous and partially  calcified plaque at the right carotid bifurcation. Lumen shadowing present low resistance waveform of the right ICA. No significant tortuosity. RIGHT VERTEBRAL ARTERY: Antegrade flow with low resistance waveform. LEFT CAROTID ARTERY: No significant calcifications of the left common carotid artery. Intermediate waveform maintained. Moderate heterogeneous and partially calcified plaque at the left carotid bifurcation. Lumen shadowing present low resistance waveform of the left ICA. No significant tortuosity. LEFT VERTEBRAL ARTERY:  Antegrade flow with low resistance waveform. IMPRESSION:  Color duplex indicates moderate heterogeneous and calcified plaque, with no hemodynamically significant stenosis by duplex criteria in the extracranial cerebrovascular circulation. Note that the flow velocities of the bilateral ICA were obtained from an area distal to the maximum narrowing due to the presence of anterior wall plaque with shadowing and may be underestimating the percentage of ICA stenosis. If there is concern for assessment of more accurate degree of stenosis, consider formal cerebral angiogram, or CT angiogram. Signed, Dulcy Fanny. Earleen Newport, DO [facility] Electronically Signed   By: Corrie Mckusick D.O.   On: 04/14/2017 11:54   Discharge Exam: Vitals:   04/15/17 0758 04/15/17 1158  BP: (!) 149/55 (!) 148/47  Pulse: (!) 52 (!) 48  Resp: 16 16  Temp: (!) 97.4 F (36.3 C) 97.7 F (36.5 C)  SpO2: 98% 100%   Vitals:   04/14/17 2358 04/15/17 0358 04/15/17 0758 04/15/17 1158  BP: (!) 158/62 (!) 138/50 (!) 149/55 (!) 148/47  Pulse: (!) 51 (!) 52 (!) 52 (!) 48  Resp: 14  16 16   Temp: (!) 97.5 F (36.4 C) 98 F (36.7 C) (!) 97.4 F (36.3 C) 97.7 F (36.5 C)  TempSrc: Oral Oral Oral Oral  SpO2: 98% 96% 98% 100%  Weight:      Height:        General: Pt is alert, awake, not in acute distress Cardiovascular: RRR, S1/S2 +, no rubs, no gallops Respiratory: CTA bilaterally, no wheezing, no rhonchi Abdominal: Soft, NT, ND, bowel sounds + Extremities: no edema, no cyanosis    The results of significant diagnostics from this hospitalization (including imaging, microbiology, ancillary and laboratory) are listed below for reference.     Microbiology: Recent Results (from the past 240 hour(s))  MRSA PCR Screening     Status: None   Collection Time: 04/14/17  4:55 PM  Result Value Ref Range Status   MRSA by PCR NEGATIVE NEGATIVE Final    Comment:        The GeneXpert MRSA Assay (FDA approved for NASAL specimens only), is  one component of a comprehensive MRSA colonization surveillance program. It is not intended to diagnose MRSA infection nor to guide or monitor treatment for MRSA infections. Performed at [facility], [address], [place], [place] [address]      Labs: BNP (last 3 results) No results for input(s): BNP in the last 8760 hours. Basic Metabolic Panel: Recent Labs  Lab 04/14/17 0315 04/15/17 0700  NA 142 140  K 3.5 3.5  CL 103 103  CO2 25 27  GLUCOSE 163* 121*  BUN 28* 28*  CREATININE 1.30* 1.22*  CALCIUM 9.5 8.8*   Liver Function Tests: Recent Labs  Lab 04/14/17 0315  AST 13*  ALT 11*  ALKPHOS 86  BILITOT 0.5  PROT 7.0  ALBUMIN 3.7   No results for input(s): LIPASE, AMYLASE in the last 168 hours. No results for input(s): AMMONIA in the last 168 hours. CBC: Recent Labs  Lab 04/14/17 0315 04/15/17 0700  WBC  6.0 5.3  NEUTROABS 3.7  --   HGB 10.3* 10.1*  HCT 31.8* 32.0*  MCV 96.7 97.9  PLT 176 168   Cardiac Enzymes: Recent Labs  Lab 04/14/17 0315  TROPONINI <0.03   BNP: Invalid input(s): POCBNP CBG: No results for input(s): GLUCAP in the last 168 hours. D-Dimer No results for input(s): DDIMER in the last 72 hours. Hgb A1c Recent Labs    04/15/17 0700  HGBA1C 6.3*   Lipid Profile Recent Labs    04/15/17 0700  CHOL 317*  HDL 66  LDLCALC 221*  TRIG 152*  CHOLHDL 4.8   Thyroid function studies Recent Labs    04/14/17 0316  TSH 10.592*   Anemia work up No results for input(s): VITAMINB12, FOLATE, FERRITIN, TIBC, IRON, RETICCTPCT in the last 72 hours. Urinalysis    Component Value Date/Time   COLORURINE STRAW (A) 04/14/2017 0332   APPEARANCEUR CLEAR 04/14/2017 0332   LABSPEC 1.006 04/14/2017 0332   PHURINE 6.0 04/14/2017 0332   GLUCOSEU NEGATIVE 04/14/2017 0332   HGBUR NEGATIVE 04/14/2017 0332   BILIRUBINUR NEGATIVE 04/14/2017 0332   KETONESUR NEGATIVE 04/14/2017 0332   PROTEINUR NEGATIVE 04/14/2017 0332   NITRITE NEGATIVE  04/14/2017 0332   LEUKOCYTESUR NEGATIVE 04/14/2017 0332   Sepsis Labs Invalid input(s): PROCALCITONIN,  WBC,  LACTICIDVEN Microbiology Recent Results (from the past 240 hour(s))  MRSA PCR Screening     Status: None   Collection Time: 04/14/17  4:55 PM  Result Value Ref Range Status   MRSA by PCR NEGATIVE NEGATIVE Final    Comment:        The GeneXpert MRSA Assay (FDA approved for NASAL specimens only), is one component of a comprehensive MRSA colonization surveillance program. It is not intended to diagnose MRSA infection nor to guide or monitor treatment for MRSA infections. Performed at [facility], [address] [place], [place] [address]      Time coordinating discharge: 35 minutes  SIGNED:   Rodena Goldmann, DO [facility] 04/15/2017, 12:22 PM Pager [phone]  If 7PM-7AM, please contact night-coverage [email or web address] Password [email or web address]

## 2017-04-15 NOTE — Care Management Note (Signed)
Case Management Note  Patient Details  Name: Amber Terry MRN: [id number] Date of Birth: Oct 06, 1935  Subjective/Objective:          Pt presented for TIA.  Pt will d/c home with assistance of daughter and [facility] PT. Pt has walker, cane and 3n1 at home.         Action/Plan: Offered HH choice to daughter via telephone.  Daughter and patient have no preference of agency and agree to [facility].  Jermaine with [facility] notified and accepted referral.   Expected Discharge Date:  04/15/17               Expected Discharge Plan:  [place]  In-House Referral:  NA  Discharge planning Services  CM Consult  Post Acute Care Choice:  Home Health Choice offered to:  Adult Children  DME Arranged:  N/A DME Agency:  NA  HH Arranged:  PT HH Agency:  [place]  Status of Service:  Completed, signed off  If discussed at [place] of Stay Meetings, dates discussed:    Additional Comments:  Claudie Leach, RN 04/15/2017, 1:18 PM

## 2017-04-15 NOTE — Progress Notes (Signed)
Amber Terry discharged Home per MD order.  Discharge instructions reviewed and discussed with the patient, all questions and concerns answered. Copy of instructions and scripts given to patient.  Allergies as of 04/15/2017      Reactions   Ampicillin Rash      Medication List    STOP taking these medications   labetalol 300 MG tablet Commonly known as:  NORMODYNE     TAKE these medications   amLODipine 10 MG tablet Commonly known as:  NORVASC Take 10 mg by mouth daily.   aspirin EC 81 MG tablet Take 2 tablets (162 mg total) by mouth daily.   atorvastatin 80 MG tablet Commonly known as:  LIPITOR Take 0.5 tablets (40 mg total) by mouth daily at 6 PM.   FISH OIL PO Take 1 capsule by mouth daily.   furosemide 20 MG tablet Commonly known as:  LASIX Take 20 mg by mouth daily as needed.   HYDROcodone-acetaminophen 5-325 MG tablet Commonly known as:  NORCO/VICODIN Take 1-2 tablets by mouth every 6 (six) hours as needed.   levothyroxine 112 MCG tablet Commonly known as:  SYNTHROID, LEVOTHROID Take 112 mcg by mouth daily before breakfast.   lisinopril 10 MG tablet Commonly known as:  PRINIVIL,ZESTRIL Take 1 tablet (10 mg total) by mouth daily. What changed:    medication strength  how much to take   Vitamin D3 1000 units Caps Take by mouth.       Patients skin is clean, dry and intact, no evidence of skin break down. IV site discontinued and catheter remains intact. Site without signs and symptoms of complications. Dressing and pressure applied.  Patient escorted to car in a wheelchair,  no distress noted upon discharge.  Amber Terry Amber Terry 04/15/2017 3:19 PM

## 2017-04-19 DIAGNOSIS — I10 Essential (primary) hypertension: Secondary | ICD-10-CM | POA: Diagnosis not present

## 2017-04-19 DIAGNOSIS — I509 Heart failure, unspecified: Secondary | ICD-10-CM | POA: Diagnosis not present

## 2017-04-19 DIAGNOSIS — E063 Autoimmune thyroiditis: Secondary | ICD-10-CM | POA: Diagnosis not present

## 2017-04-19 DIAGNOSIS — Z6838 Body mass index (BMI) 38.0-38.9, adult: Secondary | ICD-10-CM | POA: Diagnosis not present

## 2017-04-19 DIAGNOSIS — G459 Transient cerebral ischemic attack, unspecified: Secondary | ICD-10-CM | POA: Diagnosis not present

## 2017-05-31 DIAGNOSIS — E063 Autoimmune thyroiditis: Secondary | ICD-10-CM | POA: Diagnosis not present

## 2017-05-31 DIAGNOSIS — Z1389 Encounter for screening for other disorder: Secondary | ICD-10-CM | POA: Diagnosis not present

## 2017-05-31 DIAGNOSIS — Z0000 Encounter for general adult medical examination without abnormal findings: Secondary | ICD-10-CM | POA: Diagnosis not present

## 2017-05-31 DIAGNOSIS — I509 Heart failure, unspecified: Secondary | ICD-10-CM | POA: Diagnosis not present

## 2017-05-31 DIAGNOSIS — Z6838 Body mass index (BMI) 38.0-38.9, adult: Secondary | ICD-10-CM | POA: Diagnosis not present

## 2017-09-21 DIAGNOSIS — I10 Essential (primary) hypertension: Secondary | ICD-10-CM | POA: Diagnosis not present

## 2017-09-21 DIAGNOSIS — R809 Proteinuria, unspecified: Secondary | ICD-10-CM | POA: Diagnosis not present

## 2017-09-21 DIAGNOSIS — Z79899 Other long term (current) drug therapy: Secondary | ICD-10-CM | POA: Diagnosis not present

## 2017-09-21 DIAGNOSIS — N183 Chronic kidney disease, stage 3 (moderate): Secondary | ICD-10-CM | POA: Diagnosis not present

## 2017-09-21 DIAGNOSIS — E559 Vitamin D deficiency, unspecified: Secondary | ICD-10-CM | POA: Diagnosis not present

## 2017-09-21 DIAGNOSIS — D509 Iron deficiency anemia, unspecified: Secondary | ICD-10-CM | POA: Diagnosis not present

## 2017-09-26 DIAGNOSIS — I10 Essential (primary) hypertension: Secondary | ICD-10-CM | POA: Diagnosis not present

## 2017-09-26 DIAGNOSIS — N183 Chronic kidney disease, stage 3 (moderate): Secondary | ICD-10-CM | POA: Diagnosis not present

## 2017-09-26 DIAGNOSIS — I509 Heart failure, unspecified: Secondary | ICD-10-CM | POA: Diagnosis not present

## 2017-09-26 DIAGNOSIS — R809 Proteinuria, unspecified: Secondary | ICD-10-CM | POA: Diagnosis not present

## 2017-10-03 DIAGNOSIS — I10 Essential (primary) hypertension: Secondary | ICD-10-CM | POA: Diagnosis not present

## 2017-11-18 ENCOUNTER — Other Ambulatory Visit: Payer: Self-pay

## 2017-11-18 ENCOUNTER — Emergency Department (HOSPITAL_COMMUNITY)
Admission: EM | Admit: 2017-11-18 | Discharge: 2017-11-18 | Disposition: A | Discharge status: Home / Self Care | Payer: PPO | Attending: Emergency Medicine | Admitting: Emergency Medicine

## 2017-11-18 ENCOUNTER — Encounter (HOSPITAL_COMMUNITY): Payer: Self-pay

## 2017-11-18 DIAGNOSIS — T461X1A Poisoning by calcium-channel blockers, accidental (unintentional), initial encounter: Secondary | ICD-10-CM | POA: Diagnosis not present

## 2017-11-18 DIAGNOSIS — Z8673 Personal history of transient ischemic attack (TIA), and cerebral infarction without residual deficits: Secondary | ICD-10-CM | POA: Insufficient documentation

## 2017-11-18 DIAGNOSIS — Y829 Unspecified medical devices associated with adverse incidents: Secondary | ICD-10-CM | POA: Diagnosis not present

## 2017-11-18 DIAGNOSIS — I10 Essential (primary) hypertension: Secondary | ICD-10-CM | POA: Diagnosis not present

## 2017-11-18 DIAGNOSIS — T465X1A Poisoning by other antihypertensive drugs, accidental (unintentional), initial encounter: Secondary | ICD-10-CM | POA: Diagnosis not present

## 2017-11-18 DIAGNOSIS — Z79899 Other long term (current) drug therapy: Secondary | ICD-10-CM | POA: Diagnosis not present

## 2017-11-18 DIAGNOSIS — T887XXA Unspecified adverse effect of drug or medicament, initial encounter: Secondary | ICD-10-CM | POA: Diagnosis not present

## 2017-11-18 DIAGNOSIS — T50901A Poisoning by unspecified drugs, medicaments and biological substances, accidental (unintentional), initial encounter: Secondary | ICD-10-CM

## 2017-11-18 NOTE — ED Triage Notes (Signed)
Pt was referred by poison control to come in to be evaluated for 8 hours. Pt accidentally took a double dose of amlodipine. Took 2 10mg   Tablets

## 2017-11-18 NOTE — Discharge Instructions (Signed)
Take your medication as prescribed.

## 2017-11-18 NOTE — ED Provider Notes (Signed)
[facility] EMERGENCY DEPARTMENT Provider Note   CSN: [id number] Arrival date & time: 11/18/17  0049     History   Chief Complaint Chief Complaint  Patient presents with  . Took Extra BP Pill    HPI Amber Terry is a 82 y.o. female.  The history is provided by the patient.  She has history of hypertension, hyperlipidemia, transient ischemic attack and comes in after having excellently taken an extra dose of amlodipine 10 mg.  She recently had her prescription changed from 5 mg tablets to 10 mg tablets.  She had been taking 2 of the 5 mg tablets.  Tonight, she forgot that she had change the prescription and took to 10 mg tablets at 10 PM.  She feels fine now.  She denies dizziness or lightheadedness.  Poison control had been consulted and recommended she come to the ED to be observed for 8 hours.  Past Medical History:  Diagnosis Date  . Arthritis   . Cancer ([place])   . Gout   . Hypertension   . Renal disorder   . Thyroid disease     Patient Active Problem List   Diagnosis Date Noted  . TIA (transient ischemic attack) 04/14/2017  . Essential hypertension 04/14/2017  . Dyslipidemia 04/14/2017    Past Surgical History:  Procedure Laterality Date  . ABDOMINAL HYSTERECTOMY    . CHOLECYSTECTOMY    . THYROIDECTOMY       OB History   None      Home Medications    Prior to Admission medications   Medication Sig Start Date End Date Taking? Authorizing Provider  amLODipine (NORVASC) 10 MG tablet Take 10 mg by mouth daily. 12/01/13   [provider]  atorvastatin (LIPITOR) 80 MG tablet Take 0.5 tablets (40 mg total) by mouth daily at 6 PM. 04/15/17   Manuella Ghazi, Pratik D, DO  Cholecalciferol (VITAMIN D3) 1000 units CAPS Take by mouth.    [provider]  furosemide (LASIX) 20 MG tablet Take 20 mg by mouth daily as needed. 11/06/13   [provider]  HYDROcodone-acetaminophen (NORCO/VICODIN) 5-325 MG tablet Take 1-2 tablets by mouth every 6 (six) hours  as needed. Patient not taking: Reported on 05/29/2016 01/20/16   Fredia Sorrow, MD  levothyroxine (SYNTHROID, LEVOTHROID) 112 MCG tablet Take 112 mcg by mouth daily before breakfast.    [provider]  lisinopril (PRINIVIL,ZESTRIL) 10 MG tablet Take 1 tablet (10 mg total) by mouth daily. 04/15/17   Manuella Ghazi, Pratik D, DO  Omega-3 Fatty Acids (FISH OIL PO) Take 1 capsule by mouth daily.    [provider]    Family History History reviewed. No pertinent family history.  Social History Social History   Tobacco Use  . Smoking status: Never Smoker  . Smokeless tobacco: Never Used  Substance Use Topics  . Alcohol use: No  . Drug use: No     Allergies   Ampicillin   Review of Systems Review of Systems  All other systems reviewed and are negative.    Physical Exam Updated Vital Signs BP (!) 170/78   Temp 97.8 F (36.6 C) (Oral)   Ht 5\' 1"  (1.549 m)   Wt 96.2 kg   BMI 40.06 kg/m   Physical Exam  Nursing note and vitals reviewed.  82 year old female, resting comfortably and in no acute distress. Vital signs are significant for elevated systolic blood pressure. Oxygen saturation is 98%, which is normal. Head is normocephalic and atraumatic. PERRLA,  EOMI. Oropharynx is clear. Neck is nontender and supple without adenopathy or JVD. Back is nontender and there is no CVA tenderness. Lungs are clear without rales, wheezes, or rhonchi. Chest is nontender. Heart has regular rate and rhythm without murmur. Abdomen is soft, flat, nontender without masses or hepatosplenomegaly and peristalsis is normoactive. Extremities have 1+ edema, full range of motion is present. Skin is warm and dry without rash. Neurologic: Mental status is normal, cranial nerves are intact, there are no motor or sensory deficits.  ED Treatments / Results   Procedures Procedures   Medications Ordered in ED Medications - No data to display   Initial Impression / Assessment and Plan /  ED Course  I have reviewed the triage vital signs and the nursing notes.  Accidental overdose of amlodipine which is probably a nontoxic overdose.  Per poison control's recommendation, she will be observed in the ED for 8 hours following ingestion.  She will need to be observed until 6 AM.  Old records are reviewed, and she has no relevant past visits.  5:56 AM Patient has been observed in the ED and continues to have mildly elevated blood pressure.  No evidence of any toxicity to overdose of amlodipine.  She is felt to be safe for discharge.  She is instructed to resume her usual dose of all of her medications.  Final Clinical Impressions(s) / ED Diagnoses   Final diagnoses:  Accidental drug overdose, initial encounter  Essential hypertension    ED Discharge Orders    None       Delora Fuel, MD 44/96/75 [phone]

## 2017-11-27 DIAGNOSIS — I10 Essential (primary) hypertension: Secondary | ICD-10-CM | POA: Diagnosis not present

## 2017-11-27 DIAGNOSIS — Z6838 Body mass index (BMI) 38.0-38.9, adult: Secondary | ICD-10-CM | POA: Diagnosis not present

## 2017-11-27 DIAGNOSIS — M7551 Bursitis of right shoulder: Secondary | ICD-10-CM | POA: Diagnosis not present

## 2017-11-27 DIAGNOSIS — E119 Type 2 diabetes mellitus without complications: Secondary | ICD-10-CM | POA: Diagnosis not present

## 2018-01-07 DIAGNOSIS — I10 Essential (primary) hypertension: Secondary | ICD-10-CM | POA: Diagnosis not present

## 2018-01-07 DIAGNOSIS — E559 Vitamin D deficiency, unspecified: Secondary | ICD-10-CM | POA: Diagnosis not present

## 2018-01-07 DIAGNOSIS — Z79899 Other long term (current) drug therapy: Secondary | ICD-10-CM | POA: Diagnosis not present

## 2018-01-07 DIAGNOSIS — R809 Proteinuria, unspecified: Secondary | ICD-10-CM | POA: Diagnosis not present

## 2018-01-07 DIAGNOSIS — D509 Iron deficiency anemia, unspecified: Secondary | ICD-10-CM | POA: Diagnosis not present

## 2018-01-07 DIAGNOSIS — N183 Chronic kidney disease, stage 3 (moderate): Secondary | ICD-10-CM | POA: Diagnosis not present

## 2018-01-09 DIAGNOSIS — I509 Heart failure, unspecified: Secondary | ICD-10-CM | POA: Diagnosis not present

## 2018-01-09 DIAGNOSIS — R809 Proteinuria, unspecified: Secondary | ICD-10-CM | POA: Diagnosis not present

## 2018-01-09 DIAGNOSIS — N183 Chronic kidney disease, stage 3 (moderate): Secondary | ICD-10-CM | POA: Diagnosis not present

## 2018-01-09 DIAGNOSIS — D649 Anemia, unspecified: Secondary | ICD-10-CM | POA: Diagnosis not present

## 2018-02-06 DIAGNOSIS — I10 Essential (primary) hypertension: Secondary | ICD-10-CM | POA: Diagnosis not present

## 2018-02-06 DIAGNOSIS — N183 Chronic kidney disease, stage 3 (moderate): Secondary | ICD-10-CM | POA: Diagnosis not present

## 2018-02-06 DIAGNOSIS — Z79899 Other long term (current) drug therapy: Secondary | ICD-10-CM | POA: Diagnosis not present

## 2018-06-03 DIAGNOSIS — Z681 Body mass index (BMI) 19 or less, adult: Secondary | ICD-10-CM | POA: Diagnosis not present

## 2018-06-03 DIAGNOSIS — N183 Chronic kidney disease, stage 3 (moderate): Secondary | ICD-10-CM | POA: Diagnosis not present

## 2018-06-03 DIAGNOSIS — Z1389 Encounter for screening for other disorder: Secondary | ICD-10-CM | POA: Diagnosis not present

## 2018-06-03 DIAGNOSIS — I10 Essential (primary) hypertension: Secondary | ICD-10-CM | POA: Diagnosis not present

## 2018-06-03 DIAGNOSIS — E782 Mixed hyperlipidemia: Secondary | ICD-10-CM | POA: Diagnosis not present

## 2018-06-03 DIAGNOSIS — E119 Type 2 diabetes mellitus without complications: Secondary | ICD-10-CM | POA: Diagnosis not present

## 2018-06-03 DIAGNOSIS — E063 Autoimmune thyroiditis: Secondary | ICD-10-CM | POA: Diagnosis not present

## 2018-06-03 DIAGNOSIS — I509 Heart failure, unspecified: Secondary | ICD-10-CM | POA: Diagnosis not present

## 2018-06-03 DIAGNOSIS — Z0001 Encounter for general adult medical examination with abnormal findings: Secondary | ICD-10-CM | POA: Diagnosis not present

## 2018-06-03 DIAGNOSIS — M159 Polyosteoarthritis, unspecified: Secondary | ICD-10-CM | POA: Diagnosis not present

## 2018-06-28 DIAGNOSIS — E119 Type 2 diabetes mellitus without complications: Secondary | ICD-10-CM | POA: Diagnosis not present

## 2018-06-28 DIAGNOSIS — Z0001 Encounter for general adult medical examination with abnormal findings: Secondary | ICD-10-CM | POA: Diagnosis not present

## 2018-06-28 DIAGNOSIS — Z681 Body mass index (BMI) 19 or less, adult: Secondary | ICD-10-CM | POA: Diagnosis not present

## 2018-06-28 DIAGNOSIS — E7849 Other hyperlipidemia: Secondary | ICD-10-CM | POA: Diagnosis not present

## 2018-06-28 DIAGNOSIS — Z1389 Encounter for screening for other disorder: Secondary | ICD-10-CM | POA: Diagnosis not present

## 2018-07-01 DIAGNOSIS — N183 Chronic kidney disease, stage 3 (moderate): Secondary | ICD-10-CM | POA: Diagnosis not present

## 2018-07-01 DIAGNOSIS — E559 Vitamin D deficiency, unspecified: Secondary | ICD-10-CM | POA: Diagnosis not present

## 2018-07-01 DIAGNOSIS — R809 Proteinuria, unspecified: Secondary | ICD-10-CM | POA: Diagnosis not present

## 2018-07-01 DIAGNOSIS — Z79899 Other long term (current) drug therapy: Secondary | ICD-10-CM | POA: Diagnosis not present

## 2018-07-01 DIAGNOSIS — D649 Anemia, unspecified: Secondary | ICD-10-CM | POA: Diagnosis not present

## 2018-07-01 DIAGNOSIS — I10 Essential (primary) hypertension: Secondary | ICD-10-CM | POA: Diagnosis not present

## 2019-03-13 DIAGNOSIS — T148XXA Other injury of unspecified body region, initial encounter: Secondary | ICD-10-CM | POA: Diagnosis not present

## 2019-03-13 DIAGNOSIS — Z0000 Encounter for general adult medical examination without abnormal findings: Secondary | ICD-10-CM | POA: Diagnosis not present

## 2019-03-13 DIAGNOSIS — Z6841 Body Mass Index (BMI) 40.0 and over, adult: Secondary | ICD-10-CM | POA: Diagnosis not present

## 2019-03-13 DIAGNOSIS — E7849 Other hyperlipidemia: Secondary | ICD-10-CM | POA: Diagnosis not present

## 2019-03-13 DIAGNOSIS — E063 Autoimmune thyroiditis: Secondary | ICD-10-CM | POA: Diagnosis not present

## 2019-03-13 DIAGNOSIS — Z1389 Encounter for screening for other disorder: Secondary | ICD-10-CM | POA: Diagnosis not present

## 2019-04-02 DIAGNOSIS — E1122 Type 2 diabetes mellitus with diabetic chronic kidney disease: Secondary | ICD-10-CM | POA: Diagnosis not present

## 2019-04-02 DIAGNOSIS — I130 Hypertensive heart and chronic kidney disease with heart failure and stage 1 through stage 4 chronic kidney disease, or unspecified chronic kidney disease: Secondary | ICD-10-CM | POA: Diagnosis not present

## 2019-04-02 DIAGNOSIS — N183 Chronic kidney disease, stage 3 unspecified: Secondary | ICD-10-CM | POA: Diagnosis not present

## 2019-04-02 DIAGNOSIS — I509 Heart failure, unspecified: Secondary | ICD-10-CM | POA: Diagnosis not present

## 2019-04-02 DIAGNOSIS — E063 Autoimmune thyroiditis: Secondary | ICD-10-CM | POA: Diagnosis not present

## 2019-04-29 DIAGNOSIS — N1832 Chronic kidney disease, stage 3b: Secondary | ICD-10-CM | POA: Diagnosis not present

## 2019-04-29 DIAGNOSIS — Z79899 Other long term (current) drug therapy: Secondary | ICD-10-CM | POA: Diagnosis not present

## 2019-04-29 DIAGNOSIS — R809 Proteinuria, unspecified: Secondary | ICD-10-CM | POA: Diagnosis not present

## 2019-04-29 DIAGNOSIS — E559 Vitamin D deficiency, unspecified: Secondary | ICD-10-CM | POA: Diagnosis not present

## 2019-04-29 DIAGNOSIS — D631 Anemia in chronic kidney disease: Secondary | ICD-10-CM | POA: Diagnosis not present

## 2019-05-02 DIAGNOSIS — I130 Hypertensive heart and chronic kidney disease with heart failure and stage 1 through stage 4 chronic kidney disease, or unspecified chronic kidney disease: Secondary | ICD-10-CM | POA: Diagnosis not present

## 2019-05-02 DIAGNOSIS — I129 Hypertensive chronic kidney disease with stage 1 through stage 4 chronic kidney disease, or unspecified chronic kidney disease: Secondary | ICD-10-CM | POA: Diagnosis not present

## 2019-05-02 DIAGNOSIS — I509 Heart failure, unspecified: Secondary | ICD-10-CM | POA: Diagnosis not present

## 2019-05-02 DIAGNOSIS — Z79899 Other long term (current) drug therapy: Secondary | ICD-10-CM | POA: Diagnosis not present

## 2019-05-02 DIAGNOSIS — E559 Vitamin D deficiency, unspecified: Secondary | ICD-10-CM | POA: Diagnosis not present

## 2019-05-02 DIAGNOSIS — E039 Hypothyroidism, unspecified: Secondary | ICD-10-CM | POA: Diagnosis not present

## 2019-05-02 DIAGNOSIS — E1122 Type 2 diabetes mellitus with diabetic chronic kidney disease: Secondary | ICD-10-CM | POA: Diagnosis not present

## 2019-05-02 DIAGNOSIS — I5032 Chronic diastolic (congestive) heart failure: Secondary | ICD-10-CM | POA: Diagnosis not present

## 2019-05-02 DIAGNOSIS — N1832 Chronic kidney disease, stage 3b: Secondary | ICD-10-CM | POA: Diagnosis not present

## 2019-05-02 DIAGNOSIS — E6609 Other obesity due to excess calories: Secondary | ICD-10-CM | POA: Diagnosis not present

## 2019-06-02 DIAGNOSIS — I509 Heart failure, unspecified: Secondary | ICD-10-CM | POA: Diagnosis not present

## 2019-06-02 DIAGNOSIS — I130 Hypertensive heart and chronic kidney disease with heart failure and stage 1 through stage 4 chronic kidney disease, or unspecified chronic kidney disease: Secondary | ICD-10-CM | POA: Diagnosis not present

## 2019-06-02 DIAGNOSIS — E039 Hypothyroidism, unspecified: Secondary | ICD-10-CM | POA: Diagnosis not present

## 2019-06-02 DIAGNOSIS — E1122 Type 2 diabetes mellitus with diabetic chronic kidney disease: Secondary | ICD-10-CM | POA: Diagnosis not present

## 2019-07-02 DIAGNOSIS — N1831 Chronic kidney disease, stage 3a: Secondary | ICD-10-CM | POA: Diagnosis not present

## 2019-07-02 DIAGNOSIS — E1122 Type 2 diabetes mellitus with diabetic chronic kidney disease: Secondary | ICD-10-CM | POA: Diagnosis not present

## 2019-07-02 DIAGNOSIS — I509 Heart failure, unspecified: Secondary | ICD-10-CM | POA: Diagnosis not present

## 2019-07-02 DIAGNOSIS — I130 Hypertensive heart and chronic kidney disease with heart failure and stage 1 through stage 4 chronic kidney disease, or unspecified chronic kidney disease: Secondary | ICD-10-CM | POA: Diagnosis not present

## 2019-07-02 DIAGNOSIS — E039 Hypothyroidism, unspecified: Secondary | ICD-10-CM | POA: Diagnosis not present

## 2019-08-01 DIAGNOSIS — I130 Hypertensive heart and chronic kidney disease with heart failure and stage 1 through stage 4 chronic kidney disease, or unspecified chronic kidney disease: Secondary | ICD-10-CM | POA: Diagnosis not present

## 2019-08-01 DIAGNOSIS — E039 Hypothyroidism, unspecified: Secondary | ICD-10-CM | POA: Diagnosis not present

## 2019-08-01 DIAGNOSIS — E1122 Type 2 diabetes mellitus with diabetic chronic kidney disease: Secondary | ICD-10-CM | POA: Diagnosis not present

## 2019-08-01 DIAGNOSIS — I509 Heart failure, unspecified: Secondary | ICD-10-CM | POA: Diagnosis not present

## 2019-08-18 DIAGNOSIS — L989 Disorder of the skin and subcutaneous tissue, unspecified: Secondary | ICD-10-CM | POA: Diagnosis not present

## 2019-08-18 DIAGNOSIS — E119 Type 2 diabetes mellitus without complications: Secondary | ICD-10-CM | POA: Diagnosis not present

## 2019-08-18 DIAGNOSIS — Z6838 Body mass index (BMI) 38.0-38.9, adult: Secondary | ICD-10-CM | POA: Diagnosis not present

## 2019-08-18 DIAGNOSIS — M7551 Bursitis of right shoulder: Secondary | ICD-10-CM | POA: Diagnosis not present

## 2019-08-18 DIAGNOSIS — M13811 Other specified arthritis, right shoulder: Secondary | ICD-10-CM | POA: Diagnosis not present

## 2019-08-29 DIAGNOSIS — N1832 Chronic kidney disease, stage 3b: Secondary | ICD-10-CM | POA: Diagnosis not present

## 2019-08-29 DIAGNOSIS — E559 Vitamin D deficiency, unspecified: Secondary | ICD-10-CM | POA: Diagnosis not present

## 2019-08-29 DIAGNOSIS — E6609 Other obesity due to excess calories: Secondary | ICD-10-CM | POA: Diagnosis not present

## 2019-08-29 DIAGNOSIS — I129 Hypertensive chronic kidney disease with stage 1 through stage 4 chronic kidney disease, or unspecified chronic kidney disease: Secondary | ICD-10-CM | POA: Diagnosis not present

## 2019-08-29 DIAGNOSIS — I5032 Chronic diastolic (congestive) heart failure: Secondary | ICD-10-CM | POA: Diagnosis not present

## 2019-08-29 DIAGNOSIS — Z79899 Other long term (current) drug therapy: Secondary | ICD-10-CM | POA: Diagnosis not present

## 2019-09-02 DIAGNOSIS — I5032 Chronic diastolic (congestive) heart failure: Secondary | ICD-10-CM | POA: Diagnosis not present

## 2019-09-02 DIAGNOSIS — E063 Autoimmune thyroiditis: Secondary | ICD-10-CM | POA: Diagnosis not present

## 2019-09-02 DIAGNOSIS — I130 Hypertensive heart and chronic kidney disease with heart failure and stage 1 through stage 4 chronic kidney disease, or unspecified chronic kidney disease: Secondary | ICD-10-CM | POA: Diagnosis not present

## 2019-09-02 DIAGNOSIS — E1122 Type 2 diabetes mellitus with diabetic chronic kidney disease: Secondary | ICD-10-CM | POA: Diagnosis not present

## 2019-09-03 DIAGNOSIS — E6609 Other obesity due to excess calories: Secondary | ICD-10-CM | POA: Diagnosis not present

## 2019-09-03 DIAGNOSIS — I129 Hypertensive chronic kidney disease with stage 1 through stage 4 chronic kidney disease, or unspecified chronic kidney disease: Secondary | ICD-10-CM | POA: Diagnosis not present

## 2019-09-03 DIAGNOSIS — I5032 Chronic diastolic (congestive) heart failure: Secondary | ICD-10-CM | POA: Diagnosis not present

## 2019-09-03 DIAGNOSIS — N1832 Chronic kidney disease, stage 3b: Secondary | ICD-10-CM | POA: Diagnosis not present

## 2019-10-02 DIAGNOSIS — E063 Autoimmune thyroiditis: Secondary | ICD-10-CM | POA: Diagnosis not present

## 2019-10-02 DIAGNOSIS — E1122 Type 2 diabetes mellitus with diabetic chronic kidney disease: Secondary | ICD-10-CM | POA: Diagnosis not present

## 2019-10-02 DIAGNOSIS — I130 Hypertensive heart and chronic kidney disease with heart failure and stage 1 through stage 4 chronic kidney disease, or unspecified chronic kidney disease: Secondary | ICD-10-CM | POA: Diagnosis not present

## 2019-10-02 DIAGNOSIS — I509 Heart failure, unspecified: Secondary | ICD-10-CM | POA: Diagnosis not present

## 2019-11-01 DIAGNOSIS — I509 Heart failure, unspecified: Secondary | ICD-10-CM | POA: Diagnosis not present

## 2019-11-01 DIAGNOSIS — E1122 Type 2 diabetes mellitus with diabetic chronic kidney disease: Secondary | ICD-10-CM | POA: Diagnosis not present

## 2019-11-01 DIAGNOSIS — I130 Hypertensive heart and chronic kidney disease with heart failure and stage 1 through stage 4 chronic kidney disease, or unspecified chronic kidney disease: Secondary | ICD-10-CM | POA: Diagnosis not present

## 2019-11-01 DIAGNOSIS — E063 Autoimmune thyroiditis: Secondary | ICD-10-CM | POA: Diagnosis not present

## 2019-11-03 DIAGNOSIS — I5032 Chronic diastolic (congestive) heart failure: Secondary | ICD-10-CM | POA: Diagnosis not present

## 2019-11-03 DIAGNOSIS — N1832 Chronic kidney disease, stage 3b: Secondary | ICD-10-CM | POA: Diagnosis not present

## 2019-11-03 DIAGNOSIS — I129 Hypertensive chronic kidney disease with stage 1 through stage 4 chronic kidney disease, or unspecified chronic kidney disease: Secondary | ICD-10-CM | POA: Diagnosis not present

## 2019-11-06 DIAGNOSIS — N1832 Chronic kidney disease, stage 3b: Secondary | ICD-10-CM | POA: Diagnosis not present

## 2019-11-06 DIAGNOSIS — N189 Chronic kidney disease, unspecified: Secondary | ICD-10-CM | POA: Diagnosis not present

## 2019-11-06 DIAGNOSIS — D631 Anemia in chronic kidney disease: Secondary | ICD-10-CM | POA: Diagnosis not present

## 2019-11-06 DIAGNOSIS — I129 Hypertensive chronic kidney disease with stage 1 through stage 4 chronic kidney disease, or unspecified chronic kidney disease: Secondary | ICD-10-CM | POA: Diagnosis not present

## 2019-11-06 DIAGNOSIS — I5032 Chronic diastolic (congestive) heart failure: Secondary | ICD-10-CM | POA: Diagnosis not present

## 2019-12-02 DIAGNOSIS — E063 Autoimmune thyroiditis: Secondary | ICD-10-CM | POA: Diagnosis not present

## 2019-12-02 DIAGNOSIS — I509 Heart failure, unspecified: Secondary | ICD-10-CM | POA: Diagnosis not present

## 2019-12-02 DIAGNOSIS — E1122 Type 2 diabetes mellitus with diabetic chronic kidney disease: Secondary | ICD-10-CM | POA: Diagnosis not present

## 2019-12-02 DIAGNOSIS — I130 Hypertensive heart and chronic kidney disease with heart failure and stage 1 through stage 4 chronic kidney disease, or unspecified chronic kidney disease: Secondary | ICD-10-CM | POA: Diagnosis not present

## 2019-12-08 DIAGNOSIS — I5032 Chronic diastolic (congestive) heart failure: Secondary | ICD-10-CM | POA: Diagnosis not present

## 2019-12-08 DIAGNOSIS — N189 Chronic kidney disease, unspecified: Secondary | ICD-10-CM | POA: Diagnosis not present

## 2019-12-08 DIAGNOSIS — N1832 Chronic kidney disease, stage 3b: Secondary | ICD-10-CM | POA: Diagnosis not present

## 2019-12-08 DIAGNOSIS — D631 Anemia in chronic kidney disease: Secondary | ICD-10-CM | POA: Diagnosis not present

## 2019-12-08 DIAGNOSIS — I129 Hypertensive chronic kidney disease with stage 1 through stage 4 chronic kidney disease, or unspecified chronic kidney disease: Secondary | ICD-10-CM | POA: Diagnosis not present

## 2019-12-12 DIAGNOSIS — I5032 Chronic diastolic (congestive) heart failure: Secondary | ICD-10-CM | POA: Diagnosis not present

## 2019-12-12 DIAGNOSIS — I129 Hypertensive chronic kidney disease with stage 1 through stage 4 chronic kidney disease, or unspecified chronic kidney disease: Secondary | ICD-10-CM | POA: Diagnosis not present

## 2019-12-12 DIAGNOSIS — L03116 Cellulitis of left lower limb: Secondary | ICD-10-CM | POA: Diagnosis not present

## 2019-12-12 DIAGNOSIS — N1832 Chronic kidney disease, stage 3b: Secondary | ICD-10-CM | POA: Diagnosis not present

## 2019-12-12 DIAGNOSIS — E6609 Other obesity due to excess calories: Secondary | ICD-10-CM | POA: Diagnosis not present

## 2019-12-12 DIAGNOSIS — D631 Anemia in chronic kidney disease: Secondary | ICD-10-CM | POA: Diagnosis not present

## 2019-12-12 DIAGNOSIS — N189 Chronic kidney disease, unspecified: Secondary | ICD-10-CM | POA: Diagnosis not present

## 2019-12-16 DIAGNOSIS — Z881 Allergy status to other antibiotic agents status: Secondary | ICD-10-CM | POA: Diagnosis not present

## 2019-12-16 DIAGNOSIS — R58 Hemorrhage, not elsewhere classified: Secondary | ICD-10-CM | POA: Diagnosis not present

## 2019-12-16 DIAGNOSIS — S32511A Fracture of superior rim of right pubis, initial encounter for closed fracture: Secondary | ICD-10-CM | POA: Diagnosis not present

## 2019-12-16 DIAGNOSIS — M79605 Pain in left leg: Secondary | ICD-10-CM | POA: Diagnosis not present

## 2019-12-16 DIAGNOSIS — S8992XA Unspecified injury of left lower leg, initial encounter: Secondary | ICD-10-CM | POA: Diagnosis not present

## 2019-12-16 DIAGNOSIS — S32591A Other specified fracture of right pubis, initial encounter for closed fracture: Secondary | ICD-10-CM | POA: Diagnosis not present

## 2019-12-16 DIAGNOSIS — M79604 Pain in right leg: Secondary | ICD-10-CM | POA: Diagnosis not present

## 2019-12-16 DIAGNOSIS — Z681 Body mass index (BMI) 19 or less, adult: Secondary | ICD-10-CM | POA: Diagnosis not present

## 2019-12-16 DIAGNOSIS — R52 Pain, unspecified: Secondary | ICD-10-CM | POA: Diagnosis not present

## 2019-12-16 DIAGNOSIS — S81811A Laceration without foreign body, right lower leg, initial encounter: Secondary | ICD-10-CM | POA: Diagnosis not present

## 2019-12-16 DIAGNOSIS — W010XXA Fall on same level from slipping, tripping and stumbling without subsequent striking against object, initial encounter: Secondary | ICD-10-CM | POA: Diagnosis not present

## 2019-12-16 DIAGNOSIS — M79661 Pain in right lower leg: Secondary | ICD-10-CM | POA: Diagnosis not present

## 2019-12-16 DIAGNOSIS — L989 Disorder of the skin and subcutaneous tissue, unspecified: Secondary | ICD-10-CM | POA: Diagnosis not present

## 2019-12-16 DIAGNOSIS — E1165 Type 2 diabetes mellitus with hyperglycemia: Secondary | ICD-10-CM | POA: Diagnosis not present

## 2019-12-16 DIAGNOSIS — S79911A Unspecified injury of right hip, initial encounter: Secondary | ICD-10-CM | POA: Diagnosis not present

## 2019-12-16 DIAGNOSIS — Z23 Encounter for immunization: Secondary | ICD-10-CM | POA: Diagnosis not present

## 2019-12-16 DIAGNOSIS — S32501A Unspecified fracture of right pubis, initial encounter for closed fracture: Secondary | ICD-10-CM | POA: Diagnosis not present

## 2019-12-18 DIAGNOSIS — S329XXA Fracture of unspecified parts of lumbosacral spine and pelvis, initial encounter for closed fracture: Secondary | ICD-10-CM | POA: Diagnosis not present

## 2019-12-18 DIAGNOSIS — Z7401 Bed confinement status: Secondary | ICD-10-CM | POA: Diagnosis not present

## 2019-12-19 DIAGNOSIS — S81011D Laceration without foreign body, right knee, subsequent encounter: Secondary | ICD-10-CM | POA: Diagnosis not present

## 2019-12-19 DIAGNOSIS — Z4801 Encounter for change or removal of surgical wound dressing: Secondary | ICD-10-CM | POA: Diagnosis not present

## 2019-12-24 DIAGNOSIS — S81811A Laceration without foreign body, right lower leg, initial encounter: Secondary | ICD-10-CM | POA: Diagnosis not present

## 2019-12-24 DIAGNOSIS — Z7401 Bed confinement status: Secondary | ICD-10-CM | POA: Diagnosis not present

## 2019-12-24 DIAGNOSIS — S329XXA Fracture of unspecified parts of lumbosacral spine and pelvis, initial encounter for closed fracture: Secondary | ICD-10-CM | POA: Diagnosis not present

## 2019-12-24 DIAGNOSIS — S32810A Multiple fractures of pelvis with stable disruption of pelvic ring, initial encounter for closed fracture: Secondary | ICD-10-CM | POA: Diagnosis not present

## 2020-01-02 DIAGNOSIS — E063 Autoimmune thyroiditis: Secondary | ICD-10-CM | POA: Diagnosis not present

## 2020-01-02 DIAGNOSIS — I509 Heart failure, unspecified: Secondary | ICD-10-CM | POA: Diagnosis not present

## 2020-01-02 DIAGNOSIS — I130 Hypertensive heart and chronic kidney disease with heart failure and stage 1 through stage 4 chronic kidney disease, or unspecified chronic kidney disease: Secondary | ICD-10-CM | POA: Diagnosis not present

## 2020-01-02 DIAGNOSIS — E1122 Type 2 diabetes mellitus with diabetic chronic kidney disease: Secondary | ICD-10-CM | POA: Diagnosis not present

## 2020-01-05 DIAGNOSIS — M6281 Muscle weakness (generalized): Secondary | ICD-10-CM | POA: Diagnosis not present

## 2020-01-05 DIAGNOSIS — S329XXD Fracture of unspecified parts of lumbosacral spine and pelvis, subsequent encounter for fracture with routine healing: Secondary | ICD-10-CM | POA: Diagnosis not present

## 2020-01-12 DIAGNOSIS — S32810A Multiple fractures of pelvis with stable disruption of pelvic ring, initial encounter for closed fracture: Secondary | ICD-10-CM | POA: Diagnosis not present

## 2020-01-12 DIAGNOSIS — S81811A Laceration without foreign body, right lower leg, initial encounter: Secondary | ICD-10-CM | POA: Diagnosis not present

## 2020-01-24 DIAGNOSIS — S329XXA Fracture of unspecified parts of lumbosacral spine and pelvis, initial encounter for closed fracture: Secondary | ICD-10-CM | POA: Diagnosis not present

## 2020-01-24 DIAGNOSIS — Z7401 Bed confinement status: Secondary | ICD-10-CM | POA: Diagnosis not present

## 2020-01-31 DIAGNOSIS — E063 Autoimmune thyroiditis: Secondary | ICD-10-CM | POA: Diagnosis not present

## 2020-01-31 DIAGNOSIS — I509 Heart failure, unspecified: Secondary | ICD-10-CM | POA: Diagnosis not present

## 2020-01-31 DIAGNOSIS — E1122 Type 2 diabetes mellitus with diabetic chronic kidney disease: Secondary | ICD-10-CM | POA: Diagnosis not present

## 2020-01-31 DIAGNOSIS — I130 Hypertensive heart and chronic kidney disease with heart failure and stage 1 through stage 4 chronic kidney disease, or unspecified chronic kidney disease: Secondary | ICD-10-CM | POA: Diagnosis not present

## 2020-02-29 ENCOUNTER — Encounter (HOSPITAL_COMMUNITY): Payer: Self-pay | Admitting: Emergency Medicine

## 2020-02-29 ENCOUNTER — Other Ambulatory Visit: Payer: Self-pay

## 2020-02-29 ENCOUNTER — Emergency Department (HOSPITAL_COMMUNITY): Payer: Medicare Other

## 2020-02-29 ENCOUNTER — Inpatient Hospital Stay (HOSPITAL_COMMUNITY)
Admission: EM | Admit: 2020-02-29 | Discharge: 2020-03-02 | DRG: 683 | Disposition: A | Discharge status: Hospice | Payer: Medicare Other | Attending: Internal Medicine | Admitting: Internal Medicine

## 2020-02-29 DIAGNOSIS — Z6841 Body Mass Index (BMI) 40.0 and over, adult: Secondary | ICD-10-CM | POA: Diagnosis not present

## 2020-02-29 DIAGNOSIS — D509 Iron deficiency anemia, unspecified: Secondary | ICD-10-CM | POA: Diagnosis present

## 2020-02-29 DIAGNOSIS — M533 Sacrococcygeal disorders, not elsewhere classified: Secondary | ICD-10-CM | POA: Diagnosis present

## 2020-02-29 DIAGNOSIS — N179 Acute kidney failure, unspecified: Secondary | ICD-10-CM

## 2020-02-29 DIAGNOSIS — H9190 Unspecified hearing loss, unspecified ear: Secondary | ICD-10-CM | POA: Diagnosis present

## 2020-02-29 DIAGNOSIS — Z66 Do not resuscitate: Secondary | ICD-10-CM | POA: Diagnosis present

## 2020-02-29 DIAGNOSIS — M109 Gout, unspecified: Secondary | ICD-10-CM | POA: Diagnosis present

## 2020-02-29 DIAGNOSIS — Z20822 Contact with and (suspected) exposure to covid-19: Secondary | ICD-10-CM | POA: Diagnosis present

## 2020-02-29 DIAGNOSIS — Z7982 Long term (current) use of aspirin: Secondary | ICD-10-CM

## 2020-02-29 DIAGNOSIS — E890 Postprocedural hypothyroidism: Secondary | ICD-10-CM | POA: Diagnosis present

## 2020-02-29 DIAGNOSIS — E860 Dehydration: Secondary | ICD-10-CM | POA: Diagnosis present

## 2020-02-29 DIAGNOSIS — Z79899 Other long term (current) drug therapy: Secondary | ICD-10-CM

## 2020-02-29 DIAGNOSIS — Z8673 Personal history of transient ischemic attack (TIA), and cerebral infarction without residual deficits: Secondary | ICD-10-CM

## 2020-02-29 DIAGNOSIS — E785 Hyperlipidemia, unspecified: Secondary | ICD-10-CM | POA: Diagnosis present

## 2020-02-29 DIAGNOSIS — I129 Hypertensive chronic kidney disease with stage 1 through stage 4 chronic kidney disease, or unspecified chronic kidney disease: Secondary | ICD-10-CM | POA: Diagnosis present

## 2020-02-29 DIAGNOSIS — N1832 Chronic kidney disease, stage 3b: Secondary | ICD-10-CM | POA: Diagnosis present

## 2020-02-29 DIAGNOSIS — Z79890 Hormone replacement therapy (postmenopausal): Secondary | ICD-10-CM

## 2020-02-29 DIAGNOSIS — L89152 Pressure ulcer of sacral region, stage 2: Secondary | ICD-10-CM | POA: Diagnosis present

## 2020-02-29 DIAGNOSIS — D631 Anemia in chronic kidney disease: Secondary | ICD-10-CM | POA: Diagnosis present

## 2020-02-29 DIAGNOSIS — Z880 Allergy status to penicillin: Secondary | ICD-10-CM

## 2020-02-29 DIAGNOSIS — M1990 Unspecified osteoarthritis, unspecified site: Secondary | ICD-10-CM | POA: Diagnosis present

## 2020-02-29 DIAGNOSIS — S32591D Other specified fracture of right pubis, subsequent encounter for fracture with routine healing: Secondary | ICD-10-CM | POA: Diagnosis not present

## 2020-02-29 DIAGNOSIS — I959 Hypotension, unspecified: Secondary | ICD-10-CM | POA: Diagnosis present

## 2020-02-29 DIAGNOSIS — W19XXXD Unspecified fall, subsequent encounter: Secondary | ICD-10-CM | POA: Diagnosis present

## 2020-02-29 DIAGNOSIS — E1122 Type 2 diabetes mellitus with diabetic chronic kidney disease: Secondary | ICD-10-CM | POA: Diagnosis present

## 2020-02-29 DIAGNOSIS — K219 Gastro-esophageal reflux disease without esophagitis: Secondary | ICD-10-CM | POA: Diagnosis present

## 2020-02-29 DIAGNOSIS — R680 Hypothermia, not associated with low environmental temperature: Secondary | ICD-10-CM | POA: Diagnosis present

## 2020-02-29 DIAGNOSIS — E871 Hypo-osmolality and hyponatremia: Secondary | ICD-10-CM | POA: Diagnosis present

## 2020-02-29 DIAGNOSIS — R54 Age-related physical debility: Secondary | ICD-10-CM | POA: Diagnosis present

## 2020-02-29 DIAGNOSIS — E669 Obesity, unspecified: Secondary | ICD-10-CM | POA: Diagnosis present

## 2020-02-29 DIAGNOSIS — Z515 Encounter for palliative care: Secondary | ICD-10-CM | POA: Diagnosis not present

## 2020-02-29 DIAGNOSIS — Z9114 Patient's other noncompliance with medication regimen: Secondary | ICD-10-CM

## 2020-02-29 DIAGNOSIS — Z7984 Long term (current) use of oral hypoglycemic drugs: Secondary | ICD-10-CM

## 2020-02-29 DIAGNOSIS — Z7189 Other specified counseling: Secondary | ICD-10-CM | POA: Diagnosis not present

## 2020-02-29 LAB — COMPREHENSIVE METABOLIC PANEL
ALT: 19 U/L (ref 0–44)
AST: 18 U/L (ref 15–41)
Albumin: 3.2 g/dL — ABNORMAL LOW (ref 3.5–5.0)
Alkaline Phosphatase: 128 U/L — ABNORMAL HIGH (ref 38–126)
Anion gap: 12 (ref 5–15)
BUN: 93 mg/dL — ABNORMAL HIGH (ref 8–23)
CO2: 22 mmol/L (ref 22–32)
Calcium: 8.8 mg/dL — ABNORMAL LOW (ref 8.9–10.3)
Chloride: 87 mmol/L — ABNORMAL LOW (ref 98–111)
Creatinine, Ser: 2.28 mg/dL — ABNORMAL HIGH (ref 0.44–1.00)
GFR, Estimated: 21 mL/min — ABNORMAL LOW (ref >60)
Glucose, Bld: 126 mg/dL — ABNORMAL HIGH (ref 70–99)
Potassium: 4.3 mmol/L (ref 3.5–5.1)
Sodium: 121 mmol/L — ABNORMAL LOW (ref 135–145)
Total Bilirubin: 0.2 mg/dL — ABNORMAL LOW (ref 0.3–1.2)
Total Protein: 7.1 g/dL (ref 6.5–8.1)

## 2020-02-29 LAB — LACTIC ACID, PLASMA
Lactic Acid, Venous: 1.1 mmol/L (ref 0.5–1.9)
Lactic Acid, Venous: 0.8 mmol/L (ref 0.5–1.9)

## 2020-02-29 LAB — BASIC METABOLIC PANEL
Anion gap: 11 (ref 5–15)
BUN: 91 mg/dL — ABNORMAL HIGH (ref 8–23)
CO2: 22 mmol/L (ref 22–32)
Calcium: 9.3 mg/dL (ref 8.9–10.3)
Chloride: 96 mmol/L — ABNORMAL LOW (ref 98–111)
Creatinine, Ser: 2.24 mg/dL — ABNORMAL HIGH (ref 0.44–1.00)
GFR, Estimated: 21 mL/min — ABNORMAL LOW (ref >60)
Glucose, Bld: 157 mg/dL — ABNORMAL HIGH (ref 70–99)
Potassium: 5.3 mmol/L — ABNORMAL HIGH (ref 3.5–5.1)
Sodium: 129 mmol/L — ABNORMAL LOW (ref 135–145)

## 2020-02-29 LAB — CBC WITH DIFFERENTIAL/PLATELET
Abs Immature Granulocytes: 0.08 10*3/uL — ABNORMAL HIGH (ref 0.00–0.07)
Basophils Absolute: 0 10*3/uL (ref 0.0–0.1)
Basophils Relative: 0 %
Eosinophils Absolute: 0.1 10*3/uL (ref 0.0–0.5)
Eosinophils Relative: 2 %
HCT: 27.2 % — ABNORMAL LOW (ref 36.0–46.0)
Hemoglobin: 8.7 g/dL — ABNORMAL LOW (ref 12.0–15.0)
Immature Granulocytes: 1 %
Lymphocytes Relative: 24 %
Lymphs Abs: 1.4 10*3/uL (ref 0.7–4.0)
MCH: 31.3 pg (ref 26.0–34.0)
MCHC: 32 g/dL (ref 30.0–36.0)
MCV: 97.8 fL (ref 80.0–100.0)
Monocytes Absolute: 0.3 10*3/uL (ref 0.1–1.0)
Monocytes Relative: 4 %
Neutro Abs: 4.2 10*3/uL (ref 1.7–7.7)
Neutrophils Relative %: 69 %
Platelets: 237 10*3/uL (ref 150–400)
RBC: 2.78 MIL/uL — ABNORMAL LOW (ref 3.87–5.11)
RDW: 13.2 % (ref 11.5–15.5)
WBC: 6.1 10*3/uL (ref 4.0–10.5)
nRBC: 0 % (ref 0.0–0.2)

## 2020-02-29 LAB — RESP PANEL BY RT-PCR (FLU A&B, COVID) ARPGX2
Influenza A by PCR: NEGATIVE
Influenza B by PCR: NEGATIVE
SARS Coronavirus 2 by RT PCR: NEGATIVE

## 2020-02-29 LAB — APTT: aPTT: 37 seconds — ABNORMAL HIGH (ref 24–36)

## 2020-02-29 LAB — URINALYSIS, ROUTINE W REFLEX MICROSCOPIC
Bilirubin Urine: NEGATIVE
Glucose, UA: NEGATIVE mg/dL
Hgb urine dipstick: NEGATIVE
Ketones, ur: NEGATIVE mg/dL
Leukocytes,Ua: NEGATIVE
Nitrite: NEGATIVE
Protein, ur: NEGATIVE mg/dL
Specific Gravity, Urine: 1.009 (ref 1.005–1.030)
pH: 5 (ref 5.0–8.0)

## 2020-02-29 LAB — PROTIME-INR
INR: 1 (ref 0.8–1.2)
Prothrombin Time: 12.3 seconds (ref 11.4–15.2)

## 2020-02-29 LAB — GLUCOSE, CAPILLARY: Glucose-Capillary: 143 mg/dL — ABNORMAL HIGH (ref 70–99)

## 2020-02-29 LAB — TROPONIN I (HIGH SENSITIVITY)
Troponin I (High Sensitivity): 4 ng/L (ref <18)
Troponin I (High Sensitivity): 3 ng/L (ref <18)

## 2020-02-29 LAB — CULTURE, BLOOD (ROUTINE X 2)

## 2020-02-29 LAB — POC OCCULT BLOOD, ED: Fecal Occult Bld: POSITIVE — AB

## 2020-02-29 LAB — CBG MONITORING, ED: Glucose-Capillary: 155 mg/dL — ABNORMAL HIGH (ref 70–99)

## 2020-02-29 LAB — TSH: TSH: 5.004 u[IU]/mL — ABNORMAL HIGH (ref 0.350–4.500)

## 2020-02-29 LAB — BRAIN NATRIURETIC PEPTIDE: B Natriuretic Peptide: 130 pg/mL — ABNORMAL HIGH (ref 0.0–100.0)

## 2020-02-29 LAB — SODIUM, URINE, RANDOM: Sodium, Ur: 43 mmol/L

## 2020-02-29 MED ORDER — CIPROFLOXACIN IN D5W 400 MG/200ML IV SOLN
200.0000 mg | Freq: Once | INTRAVENOUS | Status: AC
  Administered 2020-02-29: 200 mg via INTRAVENOUS
  Filled 2020-02-29: qty 200

## 2020-02-29 MED ORDER — PRAVASTATIN SODIUM 10 MG PO TABS
10.0000 mg | ORAL_TABLET | ORAL | Status: DC
Start: 2020-02-29 — End: 2020-03-02
  Administered 2020-02-29 – 2020-03-02 (×2): 10 mg via ORAL
  Filled 2020-02-29 (×2): qty 1

## 2020-02-29 MED ORDER — ONDANSETRON HCL 4 MG PO TABS: 4.0000 mg | ORAL_TABLET | Freq: Four times a day (QID) | ORAL | Status: DC | PRN

## 2020-02-29 MED ORDER — FERROUS SULFATE 325 (65 FE) MG PO TABS
325.0000 mg | ORAL_TABLET | Freq: Every day | ORAL | Status: DC
  Administered 2020-02-29 – 2020-03-02 (×3): 325 mg via ORAL
  Filled 2020-02-29 (×3): qty 1

## 2020-02-29 MED ORDER — INSULIN ASPART 100 UNIT/ML ~~LOC~~ SOLN
0.0000 [IU] | Freq: Three times a day (TID) | SUBCUTANEOUS | Status: DC
  Administered 2020-03-01: 1 [IU] via SUBCUTANEOUS

## 2020-02-29 MED ORDER — LACTATED RINGERS IV BOLUS
1000.0000 mL | Freq: Once | INTRAVENOUS | Status: AC
  Administered 2020-02-29: 500 mL via INTRAVENOUS

## 2020-02-29 MED ORDER — LACTATED RINGERS IV BOLUS
500.0000 mL | Freq: Once | INTRAVENOUS | Status: AC
  Administered 2020-02-29: 500 mL via INTRAVENOUS

## 2020-02-29 MED ORDER — LEVOTHYROXINE SODIUM 112 MCG PO TABS
112.0000 ug | ORAL_TABLET | Freq: Every day | ORAL | Status: DC
  Administered 2020-03-01 – 2020-03-02 (×2): 112 ug via ORAL
  Filled 2020-02-29 (×2): qty 1

## 2020-02-29 MED ORDER — SODIUM CHLORIDE 0.9 % IV SOLN: INTRAVENOUS | Status: DC

## 2020-02-29 MED ORDER — CARVEDILOL 3.125 MG PO TABS
3.1250 mg | ORAL_TABLET | Freq: Two times a day (BID) | ORAL | Status: DC
  Administered 2020-03-01 – 2020-03-02 (×2): 3.125 mg via ORAL
  Filled 2020-02-29 (×3): qty 1

## 2020-02-29 MED ORDER — ONDANSETRON HCL 4 MG/2ML IJ SOLN
4.0000 mg | Freq: Four times a day (QID) | INTRAMUSCULAR | Status: DC | PRN
  Administered 2020-03-01: 4 mg via INTRAVENOUS
  Filled 2020-02-29: qty 2

## 2020-02-29 NOTE — ED Triage Notes (Signed)
Pt from home with family. Pt cant walk and has developed pressure ulcers to her buttocks. Family states doesn't have the resources to care for pt. Family cant move pt or change her position. Family had refused to place pt in a SNF.

## 2020-02-29 NOTE — Consult Note (Signed)
[facility] Nurse Consult Note: Reason for Consult: Consult for Stage 2 pressure injury (ED Provide noted Stage 1, Admitting provider notes Stage 2).  No photo available. Wound type:PRessure plus moisture Pressure Injury POA: Yes Measurement:To be obtained by Bedside RN with first dressing change and documented on Nursing Flow Sheet in LxWxD in cm. Wound OTL:XBWI, moist (per MD) Drainage (amount, consistency, odor) scant serous Periwound:intact, moist Dressing procedure/placement/frequency: I have provided Nursing with a POC directed at resurfacing the partial thickness Stage 2 PI, including turning and repositioning, and topical care using xeroform as a wound contact layer and a silicone foam topper dressing. Bilateral Prevalon Boots are provided for pressure injury prevention on the heels.  Recommend PT for mobility assessment and progressive mobility, Dietician for Nutritional assessment and strategies for increasing nutritional status for wound healing. If you agree, please order.  [facility] nursing team will not follow, but will remain available to this patient, the nursing and medical teams.  Please re-consult if needed. Thanks, Maudie Flakes, MSN, RN, [place], Arther Abbott  Pager# [phone]

## 2020-02-29 NOTE — Plan of Care (Signed)
°  Problem: Education: °Goal: Knowledge of General Education information will improve °Description: Including pain rating scale, medication(s)/side effects and non-pharmacologic comfort measures °Outcome: Not Progressing °  °Problem: Activity: °Goal: Risk for activity intolerance will decrease °Outcome: Not Progressing °  °Problem: Pain Managment: °Goal: General experience of comfort will improve °Outcome: Not Progressing °  °

## 2020-02-29 NOTE — ED Notes (Signed)
Angie called AC (Tim) to get baer hugger blanket so pt. Can be placed on baer hugger.

## 2020-02-29 NOTE — ED Notes (Signed)
Pt. Is very hard of hearing. Pt. Hears better out of the right ear.

## 2020-02-29 NOTE — ED Notes (Signed)
Pt. Has a stage 2 pressure injury on their bottom. Placed a pressure injury pad on pts. Bottom.

## 2020-02-29 NOTE — Progress Notes (Signed)
Lab called with a "Delta Change"  Na+ went up from 121 to 129.  Dr. Josephine Cables notified of change.  K+ went up from 4.3 to 5.3.  Dr. Honor Loh notified of change.

## 2020-02-29 NOTE — H&P (Signed)
History and Physical    Amber Terry IDP:[id number] DOB: 1935-01-18 DOA: 02/29/2020  I have briefly reviewed the patient's prior medical records in [place]  PCP: Cory Munch, PA-C  Patient coming from: home  Chief Complaint: Weakness, inability to walk  HPI: Amber Terry is a 85 y.o. female with medical history significant of gout, arthritis, hypertension, chronic kidney disease stage IIIb followed by Dr. Theador Hawthorne, baseline creatinine around 1.5 based on his notes, comes to the hospital brought by the daughter due to persistent weakness. She is very hard of hearing but AxOx3. Patient had a fall in December 2021 went to [facility] ED and was diagnosed with a closed fracture of ramus of right pubis and a leg laceration on the right.  Ever since, daughter tells me that she has been basically nonambulatory and recently has developed skin breakdown to her buttocks.  She reports no fever or chills, no chest pain, no abdominal pain, no nausea or vomiting.  Daughter reports that patient did have poor appetite and has not been eating much the last few days.  ED Course: In the ED initially she was hypotensive, hypothermic and bradycardic. Lactic acid 1.1. Blood work showed a sodium on 121 from prior normal values. CHloride 87. BUN is 93 and Cr 2.28. There was initial concern for sepsis and was given antibiotics. CXR with small trace right pleural effusion, UA unremarkable. She had an FOBT which was positive and a CT scan was ordered. We are asked for admission  Review of Systems: All systems reviewed, and apart from HPI, all negative  Past Medical History:  Diagnosis Date  . Arthritis   . Cancer ([place])   . Gout   . Hypertension   . Renal disorder   . Thyroid disease     Past Surgical History:  Procedure Laterality Date  . ABDOMINAL HYSTERECTOMY    . CHOLECYSTECTOMY    . THYROIDECTOMY       reports that she has never smoked. She has never used smokeless tobacco. She reports that she  does not drink alcohol and does not use drugs.  Allergies  Allergen Reactions  . Ampicillin Rash    History reviewed. No pertinent family history.  Prior to Admission medications   Medication Sig Start Date End Date Taking? Authorizing Provider  allopurinol (ZYLOPRIM) 100 MG tablet Take 200 mg by mouth every morning. 02/23/20  Yes [provider]  amLODipine (NORVASC) 10 MG tablet Take 10 mg by mouth daily. 12/01/13  Yes [provider]  aspirin 81 MG EC tablet Take 81 mg by mouth daily.   Yes [provider]  carvedilol (COREG) 3.125 MG tablet Take 3.125 mg by mouth 2 (two) times daily. 02/23/20  Yes [provider]  Cholecalciferol (VITAMIN D3) 1000 units CAPS Take 1 capsule by mouth daily.   Yes [provider]  ferrous sulfate 325 (65 FE) MG tablet Take 325 mg by mouth daily.   Yes [provider]  furosemide (LASIX) 40 MG tablet Take 40 mg by mouth 2 (two) times daily. 02/23/20  Yes [provider]  gabapentin (NEURONTIN) 300 MG capsule Take 300 mg by mouth at bedtime. 02/23/20  Yes [provider]  glimepiride (AMARYL) 2 MG tablet Take 2 mg by mouth every morning. 02/23/20  Yes [provider]  levothyroxine (SYNTHROID, LEVOTHROID) 112 MCG tablet Take 112 mcg by mouth daily before breakfast.   Yes [provider]  lisinopril (PRINIVIL,ZESTRIL) 10 MG tablet Take 1  tablet (10 mg total) by mouth daily. 04/15/17  Yes Shah, Pratik D, DO  Omega-3 Fatty Acids (FISH OIL PO) Take 1 capsule by mouth daily.   Yes [provider]  pravastatin (PRAVACHOL) 10 MG tablet Take 10 mg by mouth every other day. 02/23/20  Yes [provider]    Physical Exam: Vitals:   02/29/20 1600 02/29/20 1630 02/29/20 1700 02/29/20 1730  BP: (!) 131/47 (!) 128/48 (!) 132/47 (!) 121/56  Pulse: (!) 43 (!) 56 (!) 51 (!) 59  Resp: 12 10 14 12   Temp:      TempSrc:      SpO2: 95% 96% 96% 99%  Weight:      Height:         Constitutional: NAD, calm, comfortable Eyes: PERRL, lids and conjunctivae normal ENMT: Mucous membranes are moist. Posterior pharynx clear of any exudate or lesions.Normal dentition.  Neck: normal, supple Respiratory: clear to auscultation bilaterally, no wheezing, no crackles. Normal respiratory effort. No accessory muscle use.  Cardiovascular: Regular rate and rhythm, no murmurs / rubs / gallops. Trace lower extremity edema. 2+ pedal pulses.  Abdomen: no tenderness, no masses palpated. Bowel sounds positive.  Musculoskeletal: no clubbing / cyanosis. Normal muscle tone.  Skin: small sacral stage II decubitus. Crusted lesions right anterior shin Neurologic: CN 2-12 grossly intact. Strength 5/5 in all 4.  Psychiatric: Normal judgment and insight. Alert and oriented x 3. Normal mood.   Labs on Admission: I have personally reviewed following labs and imaging studies  CBC: Recent Labs  Lab 02/29/20 1508  WBC 6.1  NEUTROABS 4.2  HGB 8.7*  HCT 27.2*  MCV 97.8  PLT 024   Basic Metabolic Panel: Recent Labs  Lab 02/29/20 1508  NA 121*  K 4.3  CL 87*  CO2 22  GLUCOSE 126*  BUN 93*  CREATININE 2.28*  CALCIUM 8.8*   Liver Function Tests: Recent Labs  Lab 02/29/20 1508  AST 18  ALT 19  ALKPHOS 128*  BILITOT 0.2*  PROT 7.1  ALBUMIN 3.2*   Coagulation Profile: Recent Labs  Lab 02/29/20 1656  INR 1.0   BNP (last 3 results) No results for input(s): PROBNP in the last 8760 hours. CBG: No results for input(s): GLUCAP in the last 168 hours. Thyroid Function Tests: No results for input(s): TSH, T4TOTAL, FREET4, T3FREE, THYROIDAB in the last 72 hours. Urine analysis:    Component Value Date/Time   COLORURINE YELLOW 02/29/2020 [place] 02/29/2020 1508   LABSPEC 1.009 02/29/2020 1508   PHURINE 5.0 02/29/2020 1508   GLUCOSEU NEGATIVE 02/29/2020 1508   HGBUR NEGATIVE 02/29/2020 1508   BILIRUBINUR NEGATIVE 02/29/2020 1508   KETONESUR NEGATIVE  02/29/2020 1508   PROTEINUR NEGATIVE 02/29/2020 1508   NITRITE NEGATIVE 02/29/2020 [place] 02/29/2020 1508     Radiological Exams on Admission: DG Chest Port 1 View  Result Date: 02/29/2020 CLINICAL DATA:  Cough EXAM: PORTABLE CHEST 1 VIEW COMPARISON:  CT chest 04/20/2009 FINDINGS: Multiple lines overlie the chest limiting evaluation. Enlarged cardiac silhouette. Mediastinal contours are within normal limits. Aortic arch calcification. No definite focal consolidation. Coarsened interstitial markings. No definite pulmonary edema. Likely trace right pleural effusion. A trace left pleural effusion is not excluded. No pneumothorax. No acute osseous abnormality. IMPRESSION: 1. Enlarged cardiac silhouette, some of which may be due to an AP portable technique. 2. Likely trace right pleural effusion. Trace left pleural effusion is not excluded. 3. Limited evaluation due to overlying lines and  technique. Consider repeat PA and lateral chest x-ray. Electronically Signed   By: Iven Finn M.D.   On: 02/29/2020 16:03    EKG: Independently reviewed. [place]icard[place]   Assessment/Plan  Principal Problem Hyponatremia - hypochloremic, probably intravascularly depleted. She has a degree of chronic LE swelling and is being followed by nephrology. Looks like last December her Lasix was increased to 40 mg BID. From what the daughter says she has been having poor oral intake. She was given fluids in the ED, will place on 7 cc/h and recheck sodium this evening and again in the morning.   Active Problems AKI on CKD 3b - Cr higher than her baseline of ~1.5. Will hold ACEI, Lasix, gentle fluids and repeat in am   Anemia of CKD, positive FOBT, iron deficiency anemia- there are no reports of a clinically significant GI Bleed although FOBT is positive. CT abdomen pending.  Resume home iron -Hold aspirin, subcutaneous DVT prophylaxis until morning hemoglobin and results from the CT scan of the abdomen  and pelvis  Type 2 diabetes mellitus-hold oral agents, placed on sliding scale.  Check A1c  Essential hypertension-initially hypotensive but blood pressure normalized now.  Continue Coreg, hold lisinopril as well as furosemide  Hypothyroidism-check TSH given bradycardia, hypothermia.  Continue Synthroid.  Hyperlipidemia-continue statin  Recent fall and pubic rami fracture -PT consult  Stage II sacral ulcer, POA-wound consult  DVT prophylaxis: SCDs Code Status: DNR per daughter Family Communication: Family at bedside Disposition Plan: Probably needs SNF Bed Type: SDU Consults called: none  Obs/Inp: inpatient  At the time of admission, it appears that the appropriate admission status for this patient is INPATIENT as it is expected that patient will require hospital care > 2 midnights. This is judged to be reasonable and necessary in order to provide the required intensity of service to ensure the patient's safety given: presenting symptoms, initial radiographic and laboratory data and in the context of their chronic comorbidities. Together, these circumstances are felt to place patient at high at high risk for further clinical deterioration threatening life, limb, or organ.  Marzetta Board, MD, PhD [facility]  Contact via [email or web address]  02/29/2020, 5:53 PM

## 2020-02-29 NOTE — ED Provider Notes (Signed)
[facility] EMERGENCY DEPARTMENT Provider Note   CSN: [id number] Arrival date & time: 02/29/20  1406     History Chief Complaint  Patient presents with  . Wound Check    Amber Terry is a 85 y.o. female.  HPI   Patient with significant medical history of arthritis, hypertension, CKD stage III, TIA presents with chief complaint of sacral pain and leg pain.  Patient is a poor historian and could not obtain very much information.  She endorses that she has been having sacral pain for a long time, she also endorses leg pain and pain in her feet.  She cannot tell me if she has had any recent trauma to the area and does not know what seems to make her pain worse or better.  Patient denies headaches, fevers, chills, shortness of breath, chest pain, abdominal pain, nausea, vomiting, urinary symptoms.   After reviewing patient's chart patient sustained a superior and inferior pubic ramus fracture from a mechanical fall  in December 10, orthopedic surgery has evaluated the patient and noted that she is doing better, can bear weight on it but has decreased motility.  She has chronic lymphedema currently on Lasix, takes 40 mg in the morning 20 mg at nighttime, does not seem to be compliant with her medications.  Reading note from nurse, family cannot take care of patients any longer as she has decreased mobility, they do not have the resources and brought her here to the emergency department.  Patient's last EF was 2 years ago and showed that she has an EF of 60-65%.  Spoke with patient's daughter who endorses that patient has been more lethargic and confused over the last couple weeks, she states she has a very limited motility, can only walk a few steps.  She has been complaining more recently of leg pain and leg swelling, she has endorse that patient is taking all of her medications as prescribed.  She was mainly concerned today because she noted a lot of blood in her stool and want her to be  evaluated.  Past Medical History:  Diagnosis Date  . Arthritis   . Cancer ([facility])   . Gout   . Hypertension   . Renal disorder   . Thyroid disease     Patient Active Problem List   Diagnosis Date Noted  . TIA (transient ischemic attack) 04/14/2017  . Essential hypertension 04/14/2017  . Dyslipidemia 04/14/2017    Past Surgical History:  Procedure Laterality Date  . ABDOMINAL HYSTERECTOMY    . CHOLECYSTECTOMY    . THYROIDECTOMY       OB History   No obstetric history on file.     History reviewed. No pertinent family history.  Social History   Tobacco Use  . Smoking status: Never Smoker  . Smokeless tobacco: Never Used  Substance Use Topics  . Alcohol use: No  . Drug use: No    Home Medications Prior to Admission medications   Medication Sig Start Date End Date Taking? Authorizing Provider  allopurinol (ZYLOPRIM) 100 MG tablet Take 200 mg by mouth every morning. 02/23/20  Yes [provider]  amLODipine (NORVASC) 10 MG tablet Take 10 mg by mouth daily. 12/01/13  Yes [provider]  aspirin 81 MG EC tablet Take 81 mg by mouth daily.   Yes [provider]  carvedilol (COREG) 3.125 MG tablet Take 3.125 mg by mouth 2 (two) times daily. 02/23/20  Yes [provider]  Cholecalciferol (VITAMIN D3) 1000  units CAPS Take 1 capsule by mouth daily.   Yes [provider]  ferrous sulfate 325 (65 FE) MG tablet Take 325 mg by mouth daily.   Yes [provider]  furosemide (LASIX) 40 MG tablet Take 40 mg by mouth 2 (two) times daily. 02/23/20  Yes [provider]  gabapentin (NEURONTIN) 300 MG capsule Take 300 mg by mouth at bedtime. 02/23/20  Yes [provider]  glimepiride (AMARYL) 2 MG tablet Take 2 mg by mouth every morning. 02/23/20  Yes [provider]  levothyroxine (SYNTHROID, LEVOTHROID) 112 MCG tablet Take 112 mcg by mouth daily before breakfast.   Yes [provider]  lisinopril  (PRINIVIL,ZESTRIL) 10 MG tablet Take 1 tablet (10 mg total) by mouth daily. 04/15/17  Yes Shah, Pratik D, DO  Omega-3 Fatty Acids (FISH OIL PO) Take 1 capsule by mouth daily.   Yes [provider]  pravastatin (PRAVACHOL) 10 MG tablet Take 10 mg by mouth every other day. 02/23/20  Yes [provider]    Allergies    Ampicillin  Review of Systems   Review of Systems  Constitutional: Negative for chills and fever.  HENT: Negative for congestion.   Respiratory: Negative for shortness of breath.   Cardiovascular: Negative for chest pain.  Gastrointestinal: Positive for blood in stool. Negative for abdominal pain, diarrhea, nausea and vomiting.  Genitourinary: Positive for decreased urine volume. Negative for difficulty urinating, enuresis, vaginal bleeding, vaginal discharge and vaginal pain.  Musculoskeletal: Negative for back pain.  Skin: Negative for rash.  Neurological: Negative for dizziness.  Hematological: Does not bruise/bleed easily.    Physical Exam Updated Vital Signs BP (!) 128/48   Pulse (!) 56   Temp (!) 92.4 F (33.6 C) (Rectal)   Resp 10   Ht $R'5\' 1"'qX$  (1.549 m)   Wt 96.2 kg   SpO2 96%   BMI 40.06 kg/m   Physical Exam Vitals and nursing note reviewed. Exam conducted with a chaperone present.  Constitutional:      General: She is not in acute distress.    Appearance: She is obese. She is not ill-appearing.     Comments: Deconditioned state.  HENT:     Head: Normocephalic and atraumatic.     Nose: No congestion.     Mouth/Throat:     Mouth: Mucous membranes are dry.     Pharynx: Oropharynx is clear. No oropharyngeal exudate or posterior oropharyngeal erythema.  Eyes:     Conjunctiva/sclera: Conjunctivae normal.  Cardiovascular:     Rate and Rhythm: Regular rhythm. Bradycardia present.     Pulses: Normal pulses.     Heart sounds: No murmur heard. No friction rub. No gallop.   Pulmonary:     Effort: No respiratory distress.     Breath  sounds: No wheezing, rhonchi or rales.  Abdominal:     Palpations: Abdomen is soft.     Tenderness: There is no abdominal tenderness. There is no right CVA tenderness or left CVA tenderness.  Genitourinary:    Rectum: Guaiac result positive.     Comments: With chaperone present rectal exam was performed, she has noted external hemorrhoids present, blood noted on stool, patient was nontender during exam.  No other gross abnormalities present. Musculoskeletal:     Right lower leg: Edema present.     Left lower leg: Edema present.     Comments: Patient has 2+ pitting edema, neurovascularly intact.  Patient has full range of motion in all 4 extremities.  Skin:    General: Skin is warm and dry.     Comments: Patient has noted stage I pressure ulcers on her sacrum, no signs of surrounding infection present.  Neurological:     Mental Status: She is alert.  Psychiatric:        Mood and Affect: Mood normal.     ED Results / Procedures / Treatments   Labs (all labs ordered are listed, but only abnormal results are displayed) Labs Reviewed  COMPREHENSIVE METABOLIC PANEL - Abnormal; Notable for the following components:      Result Value   Sodium 121 (*)    Chloride 87 (*)    Glucose, Bld 126 (*)    BUN 93 (*)    Creatinine, Ser 2.28 (*)    Calcium 8.8 (*)    Albumin 3.2 (*)    Alkaline Phosphatase 128 (*)    Total Bilirubin 0.2 (*)    GFR, Estimated 21 (*)    All other components within normal limits  BRAIN NATRIURETIC PEPTIDE - Abnormal; Notable for the following components:   B Natriuretic Peptide 130.0 (*)    All other components within normal limits  CBC WITH DIFFERENTIAL/PLATELET - Abnormal; Notable for the following components:   RBC 2.78 (*)    Hemoglobin 8.7 (*)    HCT 27.2 (*)    Abs Immature Granulocytes 0.08 (*)    All other components within normal limits  APTT - Abnormal; Notable for the following components:   aPTT 37 (*)    All other components within normal  limits  POC OCCULT BLOOD, ED - Abnormal; Notable for the following components:   Fecal Occult Bld POSITIVE (*)    All other components within normal limits  CULTURE, BLOOD (ROUTINE X 2)  CULTURE, BLOOD (ROUTINE X 2)  RESP PANEL BY RT-PCR (FLU A&B, COVID) ARPGX2  URINE CULTURE  LACTIC ACID, PLASMA  URINALYSIS, ROUTINE W REFLEX MICROSCOPIC  PROTIME-INR  LACTIC ACID, PLASMA  TROPONIN I (HIGH SENSITIVITY)  TROPONIN I (HIGH SENSITIVITY)    EKG EKG Interpretation  Date/Time:  Sunday February 29 2020 14:27:43 EST Ventricular Rate:  45 PR Interval:    QRS Duration: 114 QT Interval:  533 QTC Calculation: 462 R Axis:     Text Interpretation: Bradycardia with irregular rate Borderline intraventricular conduction delay Abnormal R-wave progression, early transition No significant change since last tracing Confirmed by Aletta Edouard [phone]) on 02/29/2020 3:05:06 PM   Radiology DG Chest Port 1 View  Result Date: 02/29/2020 CLINICAL DATA:  Cough EXAM: PORTABLE CHEST 1 VIEW COMPARISON:  CT chest 04/20/2009 FINDINGS: Multiple lines overlie the chest limiting evaluation. Enlarged cardiac silhouette. Mediastinal contours are within normal limits. Aortic arch calcification. No definite focal consolidation. Coarsened interstitial markings. No definite pulmonary edema. Likely trace right pleural effusion. A trace left pleural effusion is not excluded. No pneumothorax. No acute osseous abnormality. IMPRESSION: 1. Enlarged cardiac silhouette, some of which may be due to an AP portable technique. 2. Likely trace right pleural effusion. Trace left pleural effusion is not excluded. 3. Limited evaluation due to overlying lines and technique. Consider repeat PA and lateral chest x-ray. Electronically Signed   By: Iven Finn M.D.   On: 02/29/2020 16:03    Procedures .Critical Care Performed by: Marcello Fennel, PA-C Authorized by: Marcello Fennel, PA-C   Critical care provider statement:     Critical care time (minutes):  35   Critical care was necessary to treat or prevent imminent or life-threatening deterioration  of the following conditions:  Sepsis   Critical care was time spent personally by me on the following activities:  Discussions with consultants, evaluation of patient's response to treatment, examination of patient, ordering and performing treatments and interventions, ordering and review of laboratory studies, ordering and review of radiographic studies, pulse oximetry, re-evaluation of patient's condition, obtaining history from patient or surrogate and review of old charts   I assumed direction of critical care for this patient from another provider in my specialty: no     Care discussed with: admitting provider       Medications Ordered in ED Medications  ciprofloxacin (CIPRO) IVPB 200 mg (200 mg Intravenous New Bag/Given 02/29/20 1721)  lactated ringers bolus 500 mL (0 mLs Intravenous Stopped 02/29/20 1631)  lactated ringers bolus 1,000 mL (500 mLs Intravenous New Bag/Given 02/29/20 1633)    ED Course  I have reviewed the triage vital signs and the nursing notes.  Pertinent labs & imaging results that were available during my care of the patient were reviewed by me and considered in my medical decision making (see chart for details).  Clinical Course as of 02/29/20 1729  Nancy Fetter [birth year]  1867 85 year old female who lives at home with family.  Recent pelvic fracture is now nonweightbearing has been laying in bed.  Unclear who has been managing her medications.  Here with sacral wound, low blood pressure low core temp.  Getting sepsis work-up antibiotics fluids and will need admission to the hospital for further management. [MB]    Clinical Course User Index [MB] Hayden Rasmussen, MD   MDM Rules/Calculators/A&P                          Initial impression-patient presents with chief complaint of buttock pain.  She is alert, does not appear in acute distress,  vital signs significant for hypothermia, hypotension.  Concern for sepsis, will obtain sepsis lab work-up, provide her with fluids, started on antibiotics and continue to monitor.  Work-up-CBC negative for leukocytosis, shows normocytic anemia with a hemoglobin 8.7.  CMP shows hyponatremia of 121, hyperglycemia 126, elevated creatinine of 2.28, baseline 1.1, elevated alk phos of 128, no anion gap present.  Lactic 1.1, troponin is 4.  BMP 130, Hemoccult positive.  APTT 37, prothrombin time 12.3, INR 1, UA unremarkable. Respiratory panel negative for Covid, influenza A/B.  Chest x-ray shows enlarged cardiac silhouette, likely trace right pleural effusion, trace left pleural effusion.   Consult due to AKI most likely secondary due to poor oral intake will consult with hospitalist team for further recommendations. Spoke with Dr Cruzita Lederer of the hospitalist team, he has accept the patient and will come down evaluate.  Reassessment patient was assessed after providing her with fluids, she is responded well, blood pressure is now 120/48, vital signs have remained stable.  Patient has no complaints at this time.  Recommended hospital admission for further work-up.  Patient is agreeable to this.   Rule out-I have low suspicion for ACS as patient denies chest pain, shortness of breath, EKG sinus rhythm without signs of ischemia.  Patient has initial troponin of 4.  Second opponent pending at this time.  I doubt CHF exacerbation as lungs were clear bilaterally, patient appeared dry on my exam, last EF 27 -85.  Patient does have noted 2+ pedal pitting edema but I suspect this secondary to lymphedema.  will withhold Lasix at this time as I feel  Patient is dry  and do not want to exacerbate AKI. low suspicion for UTI or pyelonephritis as UA is negative for signs of infection.  Low suspicion for pneumonia as lung are sounds are clear bilaterally, x-ray negative for acute findings.  I have low suspicion for sepsis as  patient has no leukocytosis, no signs of infection seen on lab work or on exam, lactic is normal.  I suspect hypotension secondary due to poor oral intake, this would explain hyponatremia as well as AKI.  I suspect patient suffering from hemorrhoids as she had external hemorrhoids on exam, with positive Hemoccult, doubt upper GI bleed as there is no peritoneal signs or abdominal tenderness on exam.  Plan-suspect AKI is secondary due to dehydration from poor oral intake.  Possible she may be suffering from hypothyroidism.  Anticipate patient will need further fluid rehydration and close monitoring.  Patient to be transferred to admitting team.    Final Clinical Impression(s) / ED Diagnoses Final diagnoses:  Hypotension, unspecified hypotension type  AKI (acute kidney injury) [facility])    Rx / DC Orders ED Discharge Orders    None       Marcello Fennel, PA-C 02/29/20 1757    Hayden Rasmussen, MD 03/01/20 1008

## 2020-02-29 NOTE — ED Notes (Signed)
Patient transported to CT 

## 2020-02-29 NOTE — ED Notes (Signed)
Pt. Placed on Bair hugger.

## 2020-03-01 ENCOUNTER — Encounter (HOSPITAL_COMMUNITY): Payer: Self-pay | Admitting: Internal Medicine

## 2020-03-01 DIAGNOSIS — Z515 Encounter for palliative care: Secondary | ICD-10-CM | POA: Diagnosis not present

## 2020-03-01 DIAGNOSIS — I959 Hypotension, unspecified: Secondary | ICD-10-CM | POA: Diagnosis not present

## 2020-03-01 DIAGNOSIS — Z7189 Other specified counseling: Secondary | ICD-10-CM | POA: Diagnosis not present

## 2020-03-01 DIAGNOSIS — N179 Acute kidney failure, unspecified: Secondary | ICD-10-CM | POA: Diagnosis not present

## 2020-03-01 DIAGNOSIS — E871 Hypo-osmolality and hyponatremia: Secondary | ICD-10-CM | POA: Diagnosis not present

## 2020-03-01 LAB — GLUCOSE, CAPILLARY
Glucose-Capillary: 62 mg/dL — ABNORMAL LOW (ref 70–99)
Glucose-Capillary: 132 mg/dL — ABNORMAL HIGH (ref 70–99)
Glucose-Capillary: 139 mg/dL — ABNORMAL HIGH (ref 70–99)
Glucose-Capillary: 98 mg/dL (ref 70–99)
Glucose-Capillary: 66 mg/dL — ABNORMAL LOW (ref 70–99)
Glucose-Capillary: 109 mg/dL — ABNORMAL HIGH (ref 70–99)

## 2020-03-01 LAB — URINE CULTURE: Culture: NO GROWTH

## 2020-03-01 LAB — OSMOLALITY, URINE: Osmolality, Ur: 305 mOsm/kg (ref 300–900)

## 2020-03-01 LAB — BLOOD CULTURE ID PANEL (REFLEXED) - BCID2

## 2020-03-01 LAB — CBC
HCT: 23.3 % — ABNORMAL LOW (ref 36.0–46.0)
Hemoglobin: 7.6 g/dL — ABNORMAL LOW (ref 12.0–15.0)
MCH: 31.4 pg (ref 26.0–34.0)
MCHC: 32.6 g/dL (ref 30.0–36.0)
MCV: 96.3 fL (ref 80.0–100.0)
Platelets: 218 10*3/uL (ref 150–400)
RBC: 2.42 MIL/uL — ABNORMAL LOW (ref 3.87–5.11)
RDW: 13.2 % (ref 11.5–15.5)
WBC: 5.8 10*3/uL (ref 4.0–10.5)
nRBC: 0 % (ref 0.0–0.2)

## 2020-03-01 LAB — HEMOGLOBIN A1C
Hgb A1c MFr Bld: 6.4 % — ABNORMAL HIGH (ref 4.8–5.6)
Mean Plasma Glucose: 136.98 mg/dL

## 2020-03-01 LAB — COMPREHENSIVE METABOLIC PANEL
ALT: 21 U/L (ref 0–44)
AST: 22 U/L (ref 15–41)
Albumin: 2.8 g/dL — ABNORMAL LOW (ref 3.5–5.0)
Alkaline Phosphatase: 104 U/L (ref 38–126)
Anion gap: 10 (ref 5–15)
BUN: 90 mg/dL — ABNORMAL HIGH (ref 8–23)
CO2: 25 mmol/L (ref 22–32)
Calcium: 9.4 mg/dL (ref 8.9–10.3)
Chloride: 97 mmol/L — ABNORMAL LOW (ref 98–111)
Creatinine, Ser: 2.16 mg/dL — ABNORMAL HIGH (ref 0.44–1.00)
GFR, Estimated: 22 mL/min — ABNORMAL LOW (ref >60)
Glucose, Bld: 94 mg/dL (ref 70–99)
Potassium: 4.7 mmol/L (ref 3.5–5.1)
Sodium: 132 mmol/L — ABNORMAL LOW (ref 135–145)
Total Bilirubin: 0.3 mg/dL (ref 0.3–1.2)
Total Protein: 5.9 g/dL — ABNORMAL LOW (ref 6.5–8.1)

## 2020-03-01 LAB — T4, FREE: Free T4: 1.4 ng/dL — ABNORMAL HIGH (ref 0.61–1.12)

## 2020-03-01 LAB — CULTURE, BLOOD (ROUTINE X 2)

## 2020-03-01 LAB — OSMOLALITY: Osmolality: 283 mOsm/kg (ref 275–295)

## 2020-03-01 LAB — MRSA PCR SCREENING: MRSA by PCR: NEGATIVE

## 2020-03-01 MED ORDER — CALCIUM CARBONATE ANTACID 500 MG PO CHEW: 2.0000 | CHEWABLE_TABLET | Freq: Two times a day (BID) | ORAL | Status: DC | PRN

## 2020-03-01 MED ORDER — MORPHINE SULFATE (CONCENTRATE) 10 MG/0.5ML PO SOLN: 2.6000 mg | ORAL | Status: DC | PRN

## 2020-03-01 MED ORDER — INSULIN ASPART 100 UNIT/ML ~~LOC~~ SOLN
0.0000 [IU] | Freq: Three times a day (TID) | SUBCUTANEOUS | Status: DC
  Administered 2020-03-02: 1 [IU] via SUBCUTANEOUS

## 2020-03-01 MED ORDER — ALUM & MAG HYDROXIDE-SIMETH 200-200-20 MG/5ML PO SUSP: 30.0000 mL | ORAL | Status: DC | PRN

## 2020-03-01 MED ORDER — CHLORHEXIDINE GLUCONATE CLOTH 2 % EX PADS
6.0000 | MEDICATED_PAD | Freq: Every day | CUTANEOUS | Status: DC
  Administered 2020-03-01: 6 via TOPICAL

## 2020-03-01 MED ORDER — INSULIN ASPART 100 UNIT/ML ~~LOC~~ SOLN: 0.0000 [IU] | Freq: Every day | SUBCUTANEOUS | Status: DC

## 2020-03-01 MED ORDER — PANTOPRAZOLE SODIUM 40 MG PO TBEC
40.0000 mg | DELAYED_RELEASE_TABLET | Freq: Every day | ORAL | Status: DC
  Administered 2020-03-01 – 2020-03-02 (×2): 40 mg via ORAL
  Filled 2020-03-01 (×2): qty 1

## 2020-03-01 NOTE — Progress Notes (Signed)
Date and time results received: 03/01/20 14:54 (use smartphrase ".now" to insert current time)  Test: blood culture Critical Value: [place] [facility]  Name of Provider Notified: Dr. Manuella Ghazi  Orders Received? Or Actions Taken? Continue to monitor

## 2020-03-01 NOTE — TOC Initial Note (Signed)
Transition of Care [facility]) - Initial/Assessment Note    Patient Details  Name: Amber Terry MRN: [id number] Date of Birth: [birth year]  Transition of Care [facility]) CM/SW Contact:    Amber Lucks, RN Phone Number: 03/01/2020, 3:52 PM  Clinical Narrative: Patient admitted with hyponatremia. Palliative consulted. Daughter Amber Terry at the bedside. Family has decided to go home with Hospice. They are requesting [facility]. Patient does not need any equipment, but will need EMS transport home. Patient is bed bound. Referral made to [place] at [facility]. TOC to follow. Plan to DC home tomorrow.                    Expected Discharge Plan: Home w Hospice Care Barriers to Discharge: Continued Medical Work up   Patient Goals and CMS Choice Patient states their goals for this hospitalization and ongoing recovery are:: to go home.   Choice offered to / list presented to : Adult Children  Expected Discharge Plan and Services Expected Discharge Plan: [place] arrangements for the past 2 months: [place] Date [place]: 03/01/20 Time [place]: 72 Representative spoke with at [place]: [place] Arrangements/Services Living arrangements for the past 2 months: Apartment Lives with:: Self   Do you feel safe going back to the place where you live?: Yes      Need for Family Participation in Patient Care: Yes (Comment) Care giver support system in place?: Yes (comment)   Criminal Activity/Legal Involvement Pertinent to Current Situation/Hospitalization: No - Comment as needed  Activities of Daily Living Home Assistive Devices/Equipment: Other (Comment) (Patient unable to anwer d/t dementia) ADL Screening (condition at time of admission) Patient's cognitive ability adequate to safely complete daily activities?: Yes Is the patient deaf or have difficulty hearing?: Yes Does the  patient have difficulty seeing, even when wearing glasses/contacts?: No Does the patient have difficulty concentrating, remembering, or making decisions?: Yes Patient able to express need for assistance with ADLs?: Yes Does the patient have difficulty dressing or bathing?: Yes Independently performs ADLs?: No Communication: Independent Dressing (OT): Dependent Grooming: Dependent Feeding: Independent Bathing: Dependent Is this a change from baseline?: Pre-admission baseline Toileting: Dependent Is this a change from baseline?: Pre-admission baseline In/Out Bed: Dependent Is this a change from baseline?: Pre-admission baseline Walks in Home: Dependent Is this a change from baseline?: Pre-admission baseline Does the patient have difficulty walking or climbing stairs?: Yes Weakness of Legs: Both Weakness of Arms/Hands: None  Permission Sought/Granted      Share Information with NAME: Amber Terry     Permission granted to share info w Relationship: Daughter     Emotional Assessment     Affect (typically observed): Accepting   Alcohol / Substance Use: Not Applicable Psych Involvement: No (comment)  Admission diagnosis:  Hyponatremia [E87.1] AKI (acute kidney injury) ([place]) [N17.9] Hypotension, unspecified hypotension type [I95.9] Patient Active Problem List   Diagnosis Date Noted  . Hyponatremia 02/29/2020  . TIA (transient ischemic attack) 04/14/2017  . Essential hypertension 04/14/2017  . Dyslipidemia 04/14/2017   PCP:  Cory Munch, PA-C Pharmacy:   [id number] [place], [place] [address] [place] [address] Phone: [phone] Fax: [phone]   Readmission Risk Interventions Readmission Risk Prevention Plan 03/01/2020  Medication Screening Complete  Transportation Screening Complete  Some recent data  might be hidden

## 2020-03-01 NOTE — Progress Notes (Signed)
PROGRESS NOTE    Amber Terry  VEL:[id number] DOB: [birth year] DOA: 02/29/2020 PCP: Cory Munch, PA-C   Brief Narrative:  Per HPI: Amber Terry is a 85 y.o. female with medical history significant of gout, arthritis, hypertension, chronic kidney disease stage IIIb followed by Dr. Theador Hawthorne, baseline creatinine around 1.5 based on his notes, comes to the hospital brought by the daughter due to persistent weakness. She is very hard of hearing but AxOx3. Patient had a fall in December 2021 went to [facility] ED and was diagnosed with a closed fracture of ramus of right pubis and a leg laceration on the right.  Ever since, daughter tells me that she has been basically nonambulatory and recently has developed skin breakdown to her buttocks.  She reports no fever or chills, no chest pain, no abdominal pain, no nausea or vomiting.  Daughter reports that patient did have poor appetite and has not been eating much the last few days.  -Patient was admitted with hyponatremia and likely dehydration with associated AKI on CKD stage IIIb.  She has been started on IV fluid with improvements in her sodium levels.  PT and palliative evaluations pending with likely need for placement vs home with hospice.  Assessment & Plan:   Active Problems:   Hyponatremia   Hyponatremia  -Improving with IV normal saline, continue -Likely related to dehydration with poor oral intake  AKI on CKD 3b - Cr higher than her baseline of ~1.5. Will hold ACEI, Lasix, gentle fluids and repeat in am   Anemia of CKD, positive FOBT, iron deficiency anemia- there are no reports of a clinically significant GI Bleed although FOBT is positive. Resume home iron -Hold aspirin, subcutaneous DVT prophylaxis for now -CT abdomen with no acute findings -Monitor CBC daily  Type 2 diabetes mellitus-hold oral agents, placed on sliding scale. -A1c 6.4%  Essential hypertension-initially hypotensive but blood pressure normalized now.   Continue Coreg, hold lisinopril as well as furosemide given AKI  Hypothyroidism -TSH 5.04, check free T4 -Continue levothyroxine  Hyperlipidemia-continue statin  Recent fall and pubic rami fracture -PT consult -Palliative consulted as well for goals of care  Stage II sacral ulcer, POA-wound consult   DVT prophylaxis:SCDs Code Status: DNR Family Communication: Tried calling daughter 2/28 with no response Disposition Plan:  Status is: Inpatient  Remains inpatient appropriate because:Altered mental status, IV treatments appropriate due to intensity of illness or inability to take PO and Inpatient level of care appropriate due to severity of illness   Dispo: The patient is from: Home              Anticipated d/c is to: SNF              Patient currently is not medically stable to d/c.   Difficult to place patient No  Skin Assessment:  I have examined the patient's skin and I agree with the wound assessment as performed by the wound care RN as outlined below:  Pressure Injury 02/29/20 Sacrum Mid Stage 2 -  Partial thickness loss of dermis presenting as a shallow open injury with a red, pink wound bed without slough. Bleeding, Erythema (Active)  02/29/20 2005  Location: Sacrum  Location Orientation: Mid  Staging: Stage 2 -  Partial thickness loss of dermis presenting as a shallow open injury with a red, pink wound bed without slough.  Wound Description (Comments): Bleeding, Erythema  Present on Admission: Yes    Consultants:   Palliative Care  Procedures:  See below  Antimicrobials:   None   Subjective: Patient seen and evaluated today and is very hard of hearing. No acute concerns or events noted overnight.  Objective: Vitals:   03/01/20 0500 03/01/20 0600 03/01/20 0800 03/01/20 1106  BP: (!) 122/51 (!) 127/34 (!) 128/40   Pulse: (!) 56 (!) 59 62   Resp: (!) 9 10 12    Temp: 97.6 F (36.4 C)   98 F (36.7 C)  TempSrc:    Rectal  SpO2: 95% 95% 96%    Weight:      Height:        Intake/Output Summary (Last 24 hours) at 03/01/2020 1153 Last data filed at 03/01/2020 0900 Gross per 24 hour  Intake 1597.81 ml  Output -  Net 1597.81 ml   Filed Weights   02/29/20 1413 02/29/20 2130  Weight: 96.2 kg 103.6 kg    Examination:  General exam: Appears calm and comfortable, hard of hearing Respiratory system: Clear to auscultation. Respiratory effort normal. Cardiovascular system: S1 & S2 heard, RRR.  Gastrointestinal system: Abdomen is soft Central nervous system: Alert and awake Extremities: No edema Skin: No significant lesions noted Psychiatry: Flat affect.    Data Reviewed: I have personally reviewed following labs and imaging studies  CBC: Recent Labs  Lab 02/29/20 1508 03/01/20 0508  WBC 6.1 5.8  NEUTROABS 4.2  --   HGB 8.7* 7.6*  HCT 27.2* 23.3*  MCV 97.8 96.3  PLT 237 193   Basic Metabolic Panel: Recent Labs  Lab 02/29/20 1508 02/29/20 2211 03/01/20 0508  NA 121* 129* 132*  K 4.3 5.3* 4.7  CL 87* 96* 97*  CO2 22 22 25   GLUCOSE 126* 157* 94  BUN 93* 91* 90*  CREATININE 2.28* 2.24* 2.16*  CALCIUM 8.8* 9.3 9.4   GFR: Estimated Creatinine Clearance: 24 mL/min (A) (by C-G formula based on SCr of 2.16 mg/dL (H)). Liver Function Tests: Recent Labs  Lab 02/29/20 1508 03/01/20 0508  AST 18 22  ALT 19 21  ALKPHOS 128* 104  BILITOT 0.2* 0.3  PROT 7.1 5.9*  ALBUMIN 3.2* 2.8*   No results for input(s): LIPASE, AMYLASE in the last 168 hours. No results for input(s): AMMONIA in the last 168 hours. Coagulation Profile: Recent Labs  Lab 02/29/20 1656  INR 1.0   Cardiac Enzymes: No results for input(s): CKTOTAL, CKMB, CKMBINDEX, TROPONINI in the last 168 hours. BNP (last 3 results) No results for input(s): PROBNP in the last 8760 hours. HbA1C: Recent Labs    02/29/20 1656  HGBA1C 6.4*   CBG: Recent Labs  Lab 02/29/20 1824 02/29/20 2144 03/01/20 0752 03/01/20 1008 03/01/20 1108  GLUCAP  155* 143* 62* 132* 139*   Lipid Profile: No results for input(s): CHOL, HDL, LDLCALC, TRIG, CHOLHDL, LDLDIRECT in the last 72 hours. Thyroid Function Tests: Recent Labs    02/29/20 1531  TSH 5.004*   Anemia Panel: No results for input(s): VITAMINB12, FOLATE, FERRITIN, TIBC, IRON, RETICCTPCT in the last 72 hours. Sepsis Labs: Recent Labs  Lab 02/29/20 1508 02/29/20 1656  LATICACIDVEN 1.1 0.8    Recent Results (from the past 240 hour(s))  Blood culture (routine x 2)     Status: None (Preliminary result)   Collection Time: 02/29/20  3:30 PM   Specimen: BLOOD LEFT WRIST  Result Value Ref Range Status   Specimen Description   Final    BLOOD LEFT WRIST BOTTLES DRAWN AEROBIC AND ANAEROBIC   Special Requests Blood Culture adequate volume  Final  Culture   Final    NO GROWTH < 24 HOURS Performed at [facility], [address], [place], [place] [address]    Report Status PENDING  Incomplete  Blood culture (routine x 2)     Status: None (Preliminary result)   Collection Time: 02/29/20  3:31 PM   Specimen: BLOOD LEFT HAND  Result Value Ref Range Status   Specimen Description   Final    BLOOD LEFT HAND BOTTLES DRAWN AEROBIC AND ANAEROBIC   Special Requests Blood Culture adequate volume  Final   Culture   Final    NO GROWTH < 24 HOURS Performed at [facility], [address] [place], [place] [address]    Report Status PENDING  Incomplete  Resp Panel by RT-PCR (Flu A&B, Covid) Nasopharyngeal Swab     Status: None   Collection Time: 02/29/20  3:42 PM   Specimen: Nasopharyngeal Swab; Nasopharyngeal(NP) swabs in vial transport medium  Result Value Ref Range Status   SARS Coronavirus 2 by RT PCR NEGATIVE NEGATIVE Final    Comment: (NOTE) SARS-CoV-2 target nucleic acids are NOT DETECTED.  The SARS-CoV-2 RNA is generally detectable in upper respiratory specimens during the acute phase of infection. The lowest concentration of SARS-CoV-2 viral copies this assay can detect is 138  copies/mL. A negative result does not preclude SARS-Cov-2 infection and should not be used as the sole basis for treatment or other patient management decisions. A negative result may occur with  improper specimen collection/handling, submission of specimen other than nasopharyngeal swab, presence of viral mutation(s) within the areas targeted by this assay, and inadequate number of viral copies(<138 copies/mL). A negative result must be combined with clinical observations, patient history, and epidemiological information. The expected result is Negative.  Fact Sheet for Patients:  EntrepreneurPulse.com.au  Fact Sheet for Healthcare Providers:  IncredibleEmployment.be  This test is no t yet approved or cleared by the [place] FDA and  has been authorized for detection and/or diagnosis of SARS-CoV-2 by FDA under an Emergency Use Authorization (EUA). This EUA will remain  in effect (meaning this test can be used) for the duration of the COVID-19 declaration under Section 564(b)(1) of the Act, 21 U.S.C.section 360bbb-3(b)(1), unless the authorization is terminated  or revoked sooner.       Influenza A by PCR NEGATIVE NEGATIVE Final   Influenza B by PCR NEGATIVE NEGATIVE Final    Comment: (NOTE) The Xpert Xpress SARS-CoV-2/FLU/RSV plus assay is intended as an aid in the diagnosis of influenza from Nasopharyngeal swab specimens and should not be used as a sole basis for treatment. Nasal washings and aspirates are unacceptable for Xpert Xpress SARS-CoV-2/FLU/RSV testing.  Fact Sheet for Patients: EntrepreneurPulse.com.au  Fact Sheet for Healthcare Providers: IncredibleEmployment.be  This test is not yet approved or cleared by the [place] FDA and has been authorized for detection and/or diagnosis of SARS-CoV-2 by FDA under an Emergency Use Authorization (EUA). This EUA will remain in effect (meaning  this test can be used) for the duration of the COVID-19 declaration under Section 564(b)(1) of the Act, 21 U.S.C. section 360bbb-3(b)(1), unless the authorization is terminated or revoked.  Performed at [facility], [address], [place], [place] [address]   MRSA PCR Screening     Status: None   Collection Time: 02/29/20  7:47 PM   Specimen: Nasal Mucosa; Nasopharyngeal  Result Value Ref Range Status   MRSA by PCR NEGATIVE NEGATIVE Final    Comment:  The GeneXpert MRSA Assay (FDA approved for NASAL specimens only), is one component of a comprehensive MRSA colonization surveillance program. It is not intended to diagnose MRSA infection nor to guide or monitor treatment for MRSA infections. Performed at [facility], [address] [place], [place] [address]          Radiology Studies: CT ABDOMEN PELVIS WO CONTRAST  Result Date: 02/29/2020 CLINICAL DATA:  Abdominal distension. Pressure ulcers of the buttocks. EXAM: CT ABDOMEN AND PELVIS WITHOUT CONTRAST TECHNIQUE: Multidetector CT imaging of the abdomen and pelvis was performed following the standard protocol without IV contrast. COMPARISON:  None. FINDINGS: Lower chest: Bilateral lung bases airspace consolidation versus atelectasis. Hepatobiliary: No focal liver abnormality is seen. Status post cholecystectomy. No biliary dilatation. Pancreas: Unremarkable. No pancreatic ductal dilatation or surrounding inflammatory changes. Spleen: Normal in size without focal abnormality. Adrenals/Urinary Tract: Normal adrenal glands. Atrophic kidneys. No evidence of obstructive uropathy. Normal urinary bladder. Stomach/Bowel: Stomach is within normal limits. No evidence of bowel wall thickening, distention, or inflammatory changes. Vascular/Lymphatic: Aortic atherosclerosis. No enlarged abdominal or pelvic lymph nodes. Reproductive: Status post hysterectomy. No adnexal masses. Other: No abdominal wall hernia or abnormality. No  abdominopelvic ascites. Musculoskeletal: Healing or healed fractures of the superior and inferior right pubic rami. No acute abnormalities. No cortical destructive changes in the sacrum. IMPRESSION: 1. No evidence of acute abnormalities within the abdomen or pelvis. 2. Bilateral lung bases airspace consolidation versus atelectasis. 3. Atrophic kidneys. 4. Healing or healed fractures of the superior and inferior right pubic rami. 5. Aortic atherosclerosis. Aortic Atherosclerosis (ICD10-I70.0). Electronically Signed   By: Fidela Salisbury M.D.   On: 02/29/2020 18:13   DG Chest Port 1 View  Result Date: 02/29/2020 CLINICAL DATA:  Cough EXAM: PORTABLE CHEST 1 VIEW COMPARISON:  CT chest 04/20/2009 FINDINGS: Multiple lines overlie the chest limiting evaluation. Enlarged cardiac silhouette. Mediastinal contours are within normal limits. Aortic arch calcification. No definite focal consolidation. Coarsened interstitial markings. No definite pulmonary edema. Likely trace right pleural effusion. A trace left pleural effusion is not excluded. No pneumothorax. No acute osseous abnormality. IMPRESSION: 1. Enlarged cardiac silhouette, some of which may be due to an AP portable technique. 2. Likely trace right pleural effusion. Trace left pleural effusion is not excluded. 3. Limited evaluation due to overlying lines and technique. Consider repeat PA and lateral chest x-ray. Electronically Signed   By: Iven Finn M.D.   On: 02/29/2020 16:03        Scheduled Meds: . carvedilol  3.125 mg Oral BID  . Chlorhexidine Gluconate Cloth  6 each Topical Daily  . ferrous sulfate  325 mg Oral Daily  . insulin aspart  0-5 Units Subcutaneous QHS  . insulin aspart  0-6 Units Subcutaneous TID WC  . levothyroxine  112 mcg Oral QAC breakfast  . pravastatin  10 mg Oral QODAY   Continuous Infusions: . sodium chloride Stopped (03/01/20 0008)     LOS: 1 day    Time spent: 35 minutes    Pratik Darleen Crocker, DO [facility]  If 7PM-7AM, please contact night-coverage [email or web address] 03/01/2020, 11:53 AM

## 2020-03-01 NOTE — Progress Notes (Signed)
Inpatient Diabetes Program Recommendations  AACE/ADA: New Consensus Statement on Inpatient Glycemic Control (2015)  Target Ranges:  Prepandial:   less than 140 mg/dL      Peak postprandial:   less than 180 mg/dL (1-2 hours)      Critically ill patients:  140 - 180 mg/dL   Lab Results  Component Value Date   GLUCAP 62 (L) 03/01/2020   HGBA1C 6.3 (H) 04/15/2017    Review of Glycemic Control Results for VELTA, ROCKHOLT (MRN [id number]) as of 03/01/2020 08:17  Ref. Range 02/29/2020 18:24 02/29/2020 21:44 03/01/2020 07:52  Glucose-Capillary Latest Ref Range: 70 - 99 mg/dL 155 (H) 143 (H) 62 (L)   Diabetes history: None  Current orders for Inpatient glycemic control: Novolog 0-9 units tid  Inpatient Diabetes Program Recommendations:    Hypoglycemia this am without insulin administration. Elevated renal function.   -  Reduce Novolog Correction scale to "very sensitive" 0-6 units tid.  Thanks,  Tama Headings RN, MSN, BC-ADM Inpatient Diabetes Coordinator Team Pager [phone] (8a-5p)

## 2020-03-01 NOTE — Consult Note (Addendum)
Consultation Note Date: 03/01/2020   Patient Name: Amber Terry  DOB: 02-26-1935  MRN: [id number]  Age / Sex: 85 y.o., female  PCP: Ginger Organ Referring Physician: Rodena Goldmann, DO  Reason for Consultation: Establishing goals of care  HPI/Patient Profile: 85 y.o. female  with past medical history of gout, arthritis, HTN, CKD 3B, following December with closed fracture of ramus of right pubis and of leg laceration, arthritis, thyroidectomy, abdominal hysterectomy, cholecystectomy, admitted on 02/29/2020 with hyponatremia, anemia of CKD, AKI on CKD.   Clinical Assessment and Goals of Care: I have reviewed medical records including EPIC notes, labs and imaging, received report from attending and [facility] team, examined the patient and met at bedside with daughter, Amber Terry, to discuss diagnosis prognosis, [place], EOL wishes, disposition and options.  I introduced Palliative Medicine as specialized medical care for people living with serious illness. It focuses on providing relief from the symptoms and stress of a serious illness.   [name] is lying in bed with her daughter, Amber Terry, at bedside.  She complains of nausea and GERD.  She tells me that she feels like she is going to throw up.  Nursing staff notified, as needed Zofran requested.  [name] is clearly having pain.  We move her up in bed as she requests, and she cries out loudly.  Amber Terry and I discussed her current illness and what it means in the larger context of her on-going co-morbidities.  Natural disease trajectory and expectations at EOL were discussed.  Daughter Amber Terry shares that she lives in [name] home.  She tells a story of decline since her fall and pelvis fracture in January.  She states that [name]'s other daughters are willing to help with at home care.   I attempted to elicit values and goals of care important to the patient.  Amber Terry states  that they are interested in at home hospice care.  Choice of provider offered.  Amber Terry elects hosp[facility] of [place].  We talked about "treat the treatable" care.  Initially [name] will have treat the treatable care, but I anticipate that relatively quickly she will be full comfort care.  Advanced directives, concepts specific to code status, were considered and discussed.  [name] is DNR.   Questions and concerns were addressed.  The family was encouraged to call with questions or concerns.  I encouraged wanted to lean on hospice for any questions or concerns.  Conference with attending, bedside nursing staff, transition of care team related to patient condition, needs, disposition with hospice, symptom management.  Addendum: Hospital admitting diagnosis is decreased palliative performance scale.  60% PPS November 2021, current PPS of 20.   HCPOA   NEXT OF KIN -daughter, Sydnee Levans is main contact.    [place] with the benefits of hospice care Provider choice offered, family chooses [facility] via ambulance   Code Status/Advance Care Planning:  DNR  Symptom Management:   Small dose of by  mouth morphine added  Palliative Prophylaxis:   Frequent Pain Assessment, Palliative Wound Care and Turn Reposition  Additional Recommendations (Limitations, Scope, Preferences):  Home with the benefits of hospice, continue to treat the treatable initially  Psycho-social/Spiritual:   Desire for further Chaplaincy support:no  Additional Recommendations: Caregiving  Support/Resources and Education on Hospice  Prognosis:   < 3 months, or less would be anticipated based on poor functional status, frailty, sacral wound.  Discharge Planning: Home with the benefits of treat the treatable hospice initially.      Primary Diagnoses: Present on Admission: . Hyponatremia   I have reviewed the medical record, interviewed the patient  and family, and examined the patient. The following aspects are pertinent.  Past Medical History:  Diagnosis Date  . Arthritis   . Cancer (HCC)   . Gout   . Hypertension   . Renal disorder   . Thyroid disease    Social History   Socioeconomic History  . Marital status: Widowed    Spouse name: Not on file  . Number of children: Not on file  . Years of education: Not on file  . Highest education level: Not on file  Occupational History  . Not on file  Tobacco Use  . Smoking status: Never Smoker  . Smokeless tobacco: Never Used  Substance and Sexual Activity  . Alcohol use: No  . Drug use: No  . Sexual activity: Not on file  Other Topics Concern  . Not on file  Social History Narrative  . Not on file   Social Determinants of Health   Financial Resource Strain: Not on file  Food Insecurity: Not on file  Transportation Needs: Not on file  Physical Activity: Not on file  Stress: Not on file  Social Connections: Not on file   History reviewed. No pertinent family history. Scheduled Meds: . carvedilol  3.125 mg Oral BID  . Chlorhexidine Gluconate Cloth  6 each Topical Daily  . ferrous sulfate  325 mg Oral Daily  . insulin aspart  0-9 Units Subcutaneous TID WC  . levothyroxine  112 mcg Oral QAC breakfast  . pravastatin  10 mg Oral QODAY   Continuous Infusions: . sodium chloride Stopped (03/01/20 0008)   PRN Meds:.ondansetron **OR** ondansetron (ZOFRAN) IV Medications Prior to Admission:  Prior to Admission medications   Medication Sig Start Date End Date Taking? Authorizing Provider  allopurinol (ZYLOPRIM) 100 MG tablet Take 200 mg by mouth every morning. 02/23/20  Yes [provider]  amLODipine (NORVASC) 10 MG tablet Take 10 mg by mouth daily. 12/01/13  Yes [provider]  aspirin 81 MG EC tablet Take 81 mg by mouth daily.   Yes [provider]  carvedilol (COREG) 3.125 MG tablet Take 3.125 mg by mouth 2 (two) times daily. 02/23/20  Yes  [provider]  Cholecalciferol (VITAMIN D3) 1000 units CAPS Take 1 capsule by mouth daily.   Yes [provider]  ferrous sulfate 325 (65 FE) MG tablet Take 325 mg by mouth daily.   Yes [provider]  furosemide (LASIX) 40 MG tablet Take 40 mg by mouth 2 (two) times daily. 02/23/20  Yes [provider]  gabapentin (NEURONTIN) 300 MG capsule Take 300 mg by mouth at bedtime. 02/23/20  Yes [provider]  glimepiride (AMARYL) 2 MG tablet Take 2 mg by mouth every morning. 02/23/20  Yes [provider]  levothyroxine (SYNTHROID, LEVOTHROID) 112 MCG tablet Take 112 mcg by mouth daily before breakfast.     Yes [provider]  lisinopril (PRINIVIL,ZESTRIL) 10 MG tablet Take 1 tablet (10 mg total) by mouth daily. 04/15/17  Yes Shah, Pratik D, DO  Omega-3 Fatty Acids (FISH OIL PO) Take 1 capsule by mouth daily.   Yes [provider]  pravastatin (PRAVACHOL) 10 MG tablet Take 10 mg by mouth every other day. 02/23/20  Yes [provider]   Allergies  Allergen Reactions  . Ampicillin Rash   Review of Systems  Unable to perform ROS: Age  All other systems reviewed and are negative.   Physical Exam Vitals and nursing note reviewed.  Constitutional:      General: She is in acute distress.     Appearance: She is obese. She is ill-appearing.  HENT:     Mouth/Throat:     Mouth: Mucous membranes are moist.  Cardiovascular:     Rate and Rhythm: Normal rate.  Pulmonary:     Effort: Pulmonary effort is normal. No respiratory distress.  Abdominal:     Palpations: Abdomen is soft.     Comments: Obese abdomen  Skin:    General: Skin is warm and dry.     Comments: Sacral wound  Neurological:     Mental Status: She is alert.     Comments: Orientation questions not asked  Psychiatric:     Comments: Tearful     Vital Signs: BP (!) 127/34   Pulse (!) 59   Temp 97.6 F (36.4 C)   Resp 10   Ht 5' 5" (1.651 m)   Wt  103.6 kg   SpO2 95%   BMI 38.01 kg/m  Pain Scale: 0-10   Pain Score: 0-No pain   SpO2: SpO2: 95 % O2 Device:SpO2: 95 % O2 Flow Rate: .   IO: Intake/output summary:   Intake/Output Summary (Last 24 hours) at 03/01/2020 8466 Last data filed at 03/01/2020 0400 Gross per 24 hour  Intake 1357.81 ml  Output --  Net 1357.81 ml    LBM: Last BM Date:  (Unknown) Baseline Weight: Weight: 96.2 kg Most recent weight: Weight: 103.6 kg     Palliative Assessment/Data:   Flowsheet Rows   Flowsheet Row Most Recent Value  Intake Tab   Referral Department Hospitalist  Unit at Time of Referral Cardiac/Telemetry Unit  Palliative Care Primary Diagnosis Other (Comment)  Date Notified 03/01/20  Palliative Care Type New Palliative care  Reason for referral Clarify Goals of Care  Date of Admission 02/29/20  Date first seen by Palliative Care 03/01/20  # of days Palliative referral response time 0 Day(s)  # of days IP prior to Palliative referral 1  Clinical Assessment   Palliative Performance Scale Score 30%  Pain Max last 24 hours Not able to report  Pain Min Last 24 hours Not able to report  Dyspnea Max Last 24 Hours Not able to report  Dyspnea Min Last 24 hours Not able to report  Psychosocial & Spiritual Assessment   Palliative Care Outcomes       Time In: 1440 Time Out: 1550 Time Total: 70 minutes Greater than 50%  of this time was spent counseling and coordinating care related to the above assessment and plan.  Signed by: Drue Novel, NP   Please contact Palliative Medicine Team phone at [phone] for questions and concerns.  For individual provider: See Shea Evans

## 2020-03-02 DIAGNOSIS — E871 Hypo-osmolality and hyponatremia: Secondary | ICD-10-CM | POA: Diagnosis not present

## 2020-03-02 DIAGNOSIS — Z7189 Other specified counseling: Secondary | ICD-10-CM | POA: Diagnosis not present

## 2020-03-02 DIAGNOSIS — N179 Acute kidney failure, unspecified: Secondary | ICD-10-CM | POA: Diagnosis not present

## 2020-03-02 DIAGNOSIS — Z515 Encounter for palliative care: Secondary | ICD-10-CM | POA: Diagnosis not present

## 2020-03-02 DIAGNOSIS — I959 Hypotension, unspecified: Secondary | ICD-10-CM | POA: Diagnosis not present

## 2020-03-02 LAB — GLUCOSE, CAPILLARY
Glucose-Capillary: 102 mg/dL — ABNORMAL HIGH (ref 70–99)
Glucose-Capillary: 167 mg/dL — ABNORMAL HIGH (ref 70–99)

## 2020-03-02 LAB — BASIC METABOLIC PANEL
Anion gap: 11 (ref 5–15)
BUN: 84 mg/dL — ABNORMAL HIGH (ref 8–23)
CO2: 24 mmol/L (ref 22–32)
Calcium: 9 mg/dL (ref 8.9–10.3)
Chloride: 101 mmol/L (ref 98–111)
Creatinine, Ser: 2.01 mg/dL — ABNORMAL HIGH (ref 0.44–1.00)
GFR, Estimated: 24 mL/min — ABNORMAL LOW (ref >60)
Glucose, Bld: 108 mg/dL — ABNORMAL HIGH (ref 70–99)
Potassium: 4.8 mmol/L (ref 3.5–5.1)
Sodium: 136 mmol/L (ref 135–145)

## 2020-03-02 LAB — CBC
HCT: 23.6 % — ABNORMAL LOW (ref 36.0–46.0)
Hemoglobin: 7.4 g/dL — ABNORMAL LOW (ref 12.0–15.0)
MCH: 30.7 pg (ref 26.0–34.0)
MCHC: 31.4 g/dL (ref 30.0–36.0)
MCV: 97.9 fL (ref 80.0–100.0)
Platelets: 225 10*3/uL (ref 150–400)
RBC: 2.41 MIL/uL — ABNORMAL LOW (ref 3.87–5.11)
RDW: 13.5 % (ref 11.5–15.5)
WBC: 5.6 10*3/uL (ref 4.0–10.5)
nRBC: 0.7 % — ABNORMAL HIGH (ref 0.0–0.2)

## 2020-03-02 LAB — CULTURE, BLOOD (ROUTINE X 2): Special Requests: ADEQUATE

## 2020-03-02 LAB — MAGNESIUM: Magnesium: 2.6 mg/dL — ABNORMAL HIGH (ref 1.7–2.4)

## 2020-03-02 MED ORDER — ALUM & MAG HYDROXIDE-SIMETH 200-200-20 MG/5ML PO SUSP: 30.0000 mL | ORAL | 0 refills | Status: AC | PRN

## 2020-03-02 MED ORDER — PANTOPRAZOLE SODIUM 40 MG PO TBEC: 40.0000 mg | DELAYED_RELEASE_TABLET | Freq: Every day | ORAL | 0 refills | Status: AC

## 2020-03-02 MED ORDER — MORPHINE SULFATE (CONCENTRATE) 10 MG/0.5ML PO SOLN: 2.6000 mg | ORAL | 0 refills | Status: AC | PRN

## 2020-03-02 NOTE — Progress Notes (Signed)
Notified MD that [facility] called due to positive blood cultures on patient. Blood cultures are positive for staph epidermidis.

## 2020-03-02 NOTE — Discharge Summary (Signed)
Physician Discharge Summary  Amber Terry ZTI:[id number] DOB: 04-30-1935 DOA: 02/29/2020  PCP: Cory Munch, PA-C  Admit date: 02/29/2020  Discharge date: 03/02/2020  Admitted From:Home  Disposition:  Home with hospice  Recommendations for Outpatient Follow-up:  1. Follow up with home hospice services  Home Health: Home hospice  Equipment/Devices: Hospital bed  Discharge Condition:Stable  CODE STATUS: DNR  Diet recommendation: Heart Healthy  Brief/Interim Summary: Per HPI: Amber Terry a 85 y.o.femalewith medical history significant ofgout, arthritis, hypertension, chronic kidney disease stage IIIb followed by Dr. Theador Hawthorne, baseline creatinine around 1.5 based on his notes, comes to the hospital brought by the daughter due to persistent weakness.She is very hard of hearing but AxOx3.Patient had a fall in December 2021 went to [facility] ED and was diagnosed with a closed fracture of ramus of right pubis and a leg laceration on the right. Ever since, daughter tells me that she has been basically nonambulatory and recently has developed skin breakdown to her buttocks. She reports no fever or chills, no chest pain, no abdominal pain, no nausea or vomiting. Daughter reports that patient did have poor appetite and has not been eating much the last few days.  -Patient was admitted with hyponatremia and likely dehydration with associated AKI on CKD stage IIIb.  She has been started on IV fluid with improvements in her sodium levels and kidney function as noted below.  She has been seen by palliative care given her overall deterioration that has been ongoing at home in the setting of her pubic fracture and chronic sacral ulcer.  She appears to have very poor long-term prognosis and qualifies for home hospice which family members and patient agree to.  No other acute events noted during the course of this admission and she appears to be at her usual baseline.  Discharge Diagnoses:   Active Problems:   Hyponatremia  Principal discharge diagnosis: Hyponatremia in the setting of AKI on CKD stage IIIb.  Discharge Instructions  Discharge Instructions    Diet - low sodium heart healthy   Complete by: As directed    Discharge wound care:   Complete by: As directed    Daily dressing changes.   Increase activity slowly   Complete by: As directed      Allergies as of 03/02/2020      Reactions   Ampicillin Rash      Medication List    STOP taking these medications   allopurinol 100 MG tablet Commonly known as: ZYLOPRIM   amLODipine 10 MG tablet Commonly known as: NORVASC   aspirin 81 MG EC tablet   carvedilol 3.125 MG tablet Commonly known as: COREG   ferrous sulfate 325 (65 FE) MG tablet   FISH OIL PO   furosemide 40 MG tablet Commonly known as: LASIX   glimepiride 2 MG tablet Commonly known as: AMARYL   levothyroxine 112 MCG tablet Commonly known as: SYNTHROID   lisinopril 10 MG tablet Commonly known as: ZESTRIL   pravastatin 10 MG tablet Commonly known as: PRAVACHOL   Vitamin D3 25 MCG (1000 UT) Caps     TAKE these medications   alum & mag hydroxide-simeth 200-200-20 MG/5ML suspension Commonly known as: MAALOX/MYLANTA Take 30 mLs by mouth every 4 (four) hours as needed for indigestion.   gabapentin 300 MG capsule Commonly known as: NEURONTIN Take 300 mg by mouth at bedtime.   morphine CONCENTRATE 10 MG/0.5ML Soln concentrated solution Take 0.13-0.25 mLs (2.6-5 mg total) by mouth every 3 (three) hours  as needed for moderate pain or severe pain.   pantoprazole 40 MG tablet Commonly known as: PROTONIX Take 1 tablet (40 mg total) by mouth daily. Start taking on: March 03, 2020            Discharge Care Instructions  (From admission, onward)         Start     Ordered   03/02/20 0000  Discharge wound care:       Comments: Daily dressing changes.   03/02/20 1039          Follow-up Information    [facility].   Specialty: Hospice Why:  Home with Hospice. Contact information: [address] [address] [phone]             Allergies  Allergen Reactions  . Ampicillin Rash    Consultations:  Palliative care   Procedures/Studies: CT ABDOMEN PELVIS WO CONTRAST  Result Date: 02/29/2020 CLINICAL DATA:  Abdominal distension. Pressure ulcers of the buttocks. EXAM: CT ABDOMEN AND PELVIS WITHOUT CONTRAST TECHNIQUE: Multidetector CT imaging of the abdomen and pelvis was performed following the standard protocol without IV contrast. COMPARISON:  None. FINDINGS: Lower chest: Bilateral lung bases airspace consolidation versus atelectasis. Hepatobiliary: No focal liver abnormality is seen. Status post cholecystectomy. No biliary dilatation. Pancreas: Unremarkable. No pancreatic ductal dilatation or surrounding inflammatory changes. Spleen: Normal in size without focal abnormality. Adrenals/Urinary Tract: Normal adrenal glands. Atrophic kidneys. No evidence of obstructive uropathy. Normal urinary bladder. Stomach/Bowel: Stomach is within normal limits. No evidence of bowel wall thickening, distention, or inflammatory changes. Vascular/Lymphatic: Aortic atherosclerosis. No enlarged abdominal or pelvic lymph nodes. Reproductive: Status post hysterectomy. No adnexal masses. Other: No abdominal wall hernia or abnormality. No abdominopelvic ascites. Musculoskeletal: Healing or healed fractures of the superior and inferior right pubic rami. No acute abnormalities. No cortical destructive changes in the sacrum. IMPRESSION: 1. No evidence of acute abnormalities within the abdomen or pelvis. 2. Bilateral lung bases airspace consolidation versus atelectasis. 3. Atrophic kidneys. 4. Healing or healed fractures of the superior and inferior right pubic rami. 5. Aortic atherosclerosis. Aortic Atherosclerosis (ICD10-I70.0). Electronically Signed   By: Fidela Salisbury M.D.    On: 02/29/2020 18:13   DG Chest Port 1 View  Result Date: 02/29/2020 CLINICAL DATA:  Cough EXAM: PORTABLE CHEST 1 VIEW COMPARISON:  CT chest 04/20/2009 FINDINGS: Multiple lines overlie the chest limiting evaluation. Enlarged cardiac silhouette. Mediastinal contours are within normal limits. Aortic arch calcification. No definite focal consolidation. Coarsened interstitial markings. No definite pulmonary edema. Likely trace right pleural effusion. A trace left pleural effusion is not excluded. No pneumothorax. No acute osseous abnormality. IMPRESSION: 1. Enlarged cardiac silhouette, some of which may be due to an AP portable technique. 2. Likely trace right pleural effusion. Trace left pleural effusion is not excluded. 3. Limited evaluation due to overlying lines and technique. Consider repeat PA and lateral chest x-ray. Electronically Signed   By: Iven Finn M.D.   On: 02/29/2020 16:03      Discharge Exam: Vitals:   03/01/20 2116 03/02/20 0535  BP: (!) 135/49 (!) 114/36  Pulse: 67 65  Resp: 19 19  Temp: (!) 97.5 F (36.4 C) (!) 97.4 F (36.3 C)  SpO2: 98% 97%   Vitals:   03/01/20 1202 03/01/20 1415 03/01/20 2116 03/02/20 0535  BP: (!) 121/41 (!) 126/43 (!) 135/49 (!) 114/36  Pulse: 71 68 67 65  Resp: 18 19 19 19   Temp:  97.8  F (36.6 C) (!) 97.5 F (36.4 C) (!) 97.4 F (36.3 C)  TempSrc:  Oral Oral Oral  SpO2: 97% 96% 98% 97%  Weight:      Height:        General: Pt is alert, awake, not in acute distress, hard of hearing Cardiovascular: RRR, S1/S2 +, no rubs, no gallops Respiratory: CTA bilaterally, no wheezing, no rhonchi Abdominal: Soft, NT, ND, bowel sounds + Extremities: Chronic lower extremity edema bilateral    The results of significant diagnostics from this hospitalization (including imaging, microbiology, ancillary and laboratory) are listed below for reference.     Microbiology: Recent Results (from the past 240 hour(s))  Blood culture (routine x 2)      Status: None (Preliminary result)   Collection Time: 02/29/20  3:30 PM   Specimen: BLOOD LEFT WRIST  Result Value Ref Range Status   Specimen Description   Final    BLOOD LEFT WRIST BOTTLES DRAWN AEROBIC AND ANAEROBIC   Special Requests Blood Culture adequate volume  Final   Culture   Final    NO GROWTH 2 DAYS Performed at [facility], [address], [place], [place] [address]    Report Status PENDING  Incomplete  Blood culture (routine x 2)     Status: Abnormal   Collection Time: 02/29/20  3:31 PM   Specimen: BLOOD LEFT HAND  Result Value Ref Range Status   Specimen Description   Final    BLOOD LEFT HAND BOTTLES DRAWN AEROBIC AND ANAEROBIC Performed at [facility], [address], [place], [place] [address]    Special Requests   Final    Blood Culture adequate volume Performed at [facility], [address], [place], [place] [address]    Culture  Setup Time   Final    AEROBIC BOTTLE ONLY GRAM POSITIVE COCCI Gram Stain Report Called to,Read Back By and Verified With:  [facility] POINDEXTER 03/01/20 @1437  BY S. BEARD Organism ID to follow    Culture (A)  Final    STAPHYLOCOCCUS EPIDERMIDIS THE SIGNIFICANCE OF ISOLATING THIS ORGANISM FROM A SINGLE SET OF BLOOD CULTURES WHEN MULTIPLE SETS ARE DRAWN IS UNCERTAIN. PLEASE NOTIFY THE MICROBIOLOGY DEPARTMENT WITHIN ONE WEEK IF SPECIATION AND SENSITIVITIES ARE REQUIRED. Performed at [facility], [place] [address] [employer], [place] [address]    Report Status 03/02/2020 FINAL  Final  Blood Culture ID Panel (Reflexed)     Status: Abnormal   Collection Time: 02/29/20  3:31 PM  Result Value Ref Range Status   Enterococcus faecalis NOT DETECTED NOT DETECTED Final   Enterococcus Faecium NOT DETECTED NOT DETECTED Final   Listeria monocytogenes NOT DETECTED NOT DETECTED Final   Staphylococcus species DETECTED (A) NOT DETECTED Final    Comment: CRITICAL RESULT CALLED TO, READ BACK BY AND VERIFIED WITH: Royston Bake RN 2123 03/01/20 A BROWNING     Staphylococcus aureus (BCID) NOT DETECTED NOT DETECTED Final   Staphylococcus epidermidis DETECTED (A) NOT DETECTED Final    Comment: CRITICAL RESULT CALLED TO, READ BACK BY AND VERIFIED WITH: P BROWN RN 2123 03/01/20 A BROWNING    Staphylococcus lugdunensis NOT DETECTED NOT DETECTED Final   Streptococcus species NOT DETECTED NOT DETECTED Final   Streptococcus agalactiae NOT DETECTED NOT DETECTED Final   Streptococcus pneumoniae NOT DETECTED NOT DETECTED Final   Streptococcus pyogenes NOT DETECTED NOT DETECTED Final   A.calcoaceticus-baumannii NOT DETECTED NOT DETECTED Final   Bacteroides fragilis NOT DETECTED NOT DETECTED Final   Enterobacterales NOT DETECTED NOT DETECTED  Final   Enterobacter cloacae complex NOT DETECTED NOT DETECTED Final   Escherichia coli NOT DETECTED NOT DETECTED Final   Klebsiella aerogenes NOT DETECTED NOT DETECTED Final   Klebsiella oxytoca NOT DETECTED NOT DETECTED Final   Klebsiella pneumoniae NOT DETECTED NOT DETECTED Final   Proteus species NOT DETECTED NOT DETECTED Final   Salmonella species NOT DETECTED NOT DETECTED Final   Serratia marcescens NOT DETECTED NOT DETECTED Final   Haemophilus influenzae NOT DETECTED NOT DETECTED Final   Neisseria meningitidis NOT DETECTED NOT DETECTED Final   Pseudomonas aeruginosa NOT DETECTED NOT DETECTED Final   Stenotrophomonas maltophilia NOT DETECTED NOT DETECTED Final   Candida albicans NOT DETECTED NOT DETECTED Final   Candida auris NOT DETECTED NOT DETECTED Final   Candida glabrata NOT DETECTED NOT DETECTED Final   Candida krusei NOT DETECTED NOT DETECTED Final   Candida parapsilosis NOT DETECTED NOT DETECTED Final   Candida tropicalis NOT DETECTED NOT DETECTED Final   Cryptococcus neoformans/gattii NOT DETECTED NOT DETECTED Final   Methicillin resistance mecA/C NOT DETECTED NOT DETECTED Final    Comment: Performed at [facility], [place] [address] [place], [place] [address]  Resp Panel by RT-PCR (Flu A&B,  Covid) Nasopharyngeal Swab     Status: None   Collection Time: 02/29/20  3:42 PM   Specimen: Nasopharyngeal Swab; Nasopharyngeal(NP) swabs in vial transport medium  Result Value Ref Range Status   SARS Coronavirus 2 by RT PCR NEGATIVE NEGATIVE Final    Comment: (NOTE) SARS-CoV-2 target nucleic acids are NOT DETECTED.  The SARS-CoV-2 RNA is generally detectable in upper respiratory specimens during the acute phase of infection. The lowest concentration of SARS-CoV-2 viral copies this assay can detect is 138 copies/mL. A negative result does not preclude SARS-Cov-2 infection and should not be used as the sole basis for treatment or other patient management decisions. A negative result may occur with  improper specimen collection/handling, submission of specimen other than nasopharyngeal swab, presence of viral mutation(s) within the areas targeted by this assay, and inadequate number of viral copies(<138 copies/mL). A negative result must be combined with clinical observations, patient history, and epidemiological information. The expected result is Negative.  Fact Sheet for Patients:  EntrepreneurPulse.com.au  Fact Sheet for Healthcare Providers:  IncredibleEmployment.be  This test is no t yet approved or cleared by the [place] FDA and  has been authorized for detection and/or diagnosis of SARS-CoV-2 by FDA under an Emergency Use Authorization (EUA). This EUA will remain  in effect (meaning this test can be used) for the duration of the COVID-19 declaration under Section 564(b)(1) of the Act, 21 U.S.C.section 360bbb-3(b)(1), unless the authorization is terminated  or revoked sooner.       Influenza A by PCR NEGATIVE NEGATIVE Final   Influenza B by PCR NEGATIVE NEGATIVE Final    Comment: (NOTE) The Xpert Xpress SARS-CoV-2/FLU/RSV plus assay is intended as an aid in the diagnosis of influenza from Nasopharyngeal swab specimens and should  not be used as a sole basis for treatment. Nasal washings and aspirates are unacceptable for Xpert Xpress SARS-CoV-2/FLU/RSV testing.  Fact Sheet for Patients: EntrepreneurPulse.com.au  Fact Sheet for Healthcare Providers: IncredibleEmployment.be  This test is not yet approved or cleared by the [place] FDA and has been authorized for detection and/or diagnosis of SARS-CoV-2 by FDA under an Emergency Use Authorization (EUA). This EUA will remain in effect (meaning this test can be used) for the duration of the COVID-19 declaration under Section 564(b)(1) of the  Act, 21 U.S.C. section 360bbb-3(b)(1), unless the authorization is terminated or revoked.  Performed at [facility], [address], [place], [place] [address]   Urine culture     Status: None   Collection Time: 02/29/20  4:44 PM   Specimen: Urine, Catheterized  Result Value Ref Range Status   Specimen Description   Final    URINE, CATHETERIZED Performed at [facility], [address], [place], [place] [address]    Special Requests   Final    NONE Performed at [facility], [address], [place], [place] [address]    Culture   Final    NO GROWTH Performed at [facility], [place] [address], [place], [place] [address]    Report Status 03/01/2020 FINAL  Final  MRSA PCR Screening     Status: None   Collection Time: 02/29/20  7:47 PM   Specimen: Nasal Mucosa; Nasopharyngeal  Result Value Ref Range Status   MRSA by PCR NEGATIVE NEGATIVE Final    Comment:        The GeneXpert MRSA Assay (FDA approved for NASAL specimens only), is one component of a comprehensive MRSA colonization surveillance program. It is not intended to diagnose MRSA infection nor to guide or monitor treatment for MRSA infections. Performed at [facility], [address] [place], [place] [address]      Labs: BNP (last 3 results) Recent Labs    02/29/20 1508  BNP 440.1*   Basic Metabolic  Panel: Recent Labs  Lab 02/29/20 1508 02/29/20 2211 03/01/20 0508 03/02/20 0412  NA 121* 129* 132* 136  K 4.3 5.3* 4.7 4.8  CL 87* 96* 97* 101  CO2 22 22 25 24   GLUCOSE 126* 157* 94 108*  BUN 93* 91* 90* 84*  CREATININE 2.28* 2.24* 2.16* 2.01*  CALCIUM 8.8* 9.3 9.4 9.0  MG  --   --   --  2.6*   Liver Function Tests: Recent Labs  Lab 02/29/20 1508 03/01/20 0508  AST 18 22  ALT 19 21  ALKPHOS 128* 104  BILITOT 0.2* 0.3  PROT 7.1 5.9*  ALBUMIN 3.2* 2.8*   No results for input(s): LIPASE, AMYLASE in the last 168 hours. No results for input(s): AMMONIA in the last 168 hours. CBC: Recent Labs  Lab 02/29/20 1508 03/01/20 0508 03/02/20 0412  WBC 6.1 5.8 5.6  NEUTROABS 4.2  --   --   HGB 8.7* 7.6* 7.4*  HCT 27.2* 23.3* 23.6*  MCV 97.8 96.3 97.9  PLT 237 218 225   Cardiac Enzymes: No results for input(s): CKTOTAL, CKMB, CKMBINDEX, TROPONINI in the last 168 hours. BNP: Invalid input(s): POCBNP CBG: Recent Labs  Lab 03/01/20 1610 03/01/20 2118 03/01/20 2216 03/02/20 0724 03/02/20 1105  GLUCAP 98 66* 109* 102* 167*   D-Dimer No results for input(s): DDIMER in the last 72 hours. Hgb A1c Recent Labs    02/29/20 1656  HGBA1C 6.4*   Lipid Profile No results for input(s): CHOL, HDL, LDLCALC, TRIG, CHOLHDL, LDLDIRECT in the last 72 hours. Thyroid function studies Recent Labs    02/29/20 1531  TSH 5.004*   Anemia work up No results for input(s): VITAMINB12, FOLATE, FERRITIN, TIBC, IRON, RETICCTPCT in the last 72 hours. Urinalysis    Component Value Date/Time   COLORURINE YELLOW 02/29/2020 1508   APPEARANCEUR CLEAR 02/29/2020 1508   LABSPEC 1.009 02/29/2020 1508   PHURINE 5.0 02/29/2020 1508   GLUCOSEU NEGATIVE 02/29/2020 1508   HGBUR NEGATIVE 02/29/2020 1508   BILIRUBINUR NEGATIVE  02/29/2020 1508   KETONESUR NEGATIVE 02/29/2020 1508   PROTEINUR NEGATIVE 02/29/2020 1508   NITRITE NEGATIVE 02/29/2020 1508   LEUKOCYTESUR NEGATIVE 02/29/2020 1508    Sepsis Labs Invalid input(s): PROCALCITONIN,  WBC,  LACTICIDVEN Microbiology Recent Results (from the past 240 hour(s))  Blood culture (routine x 2)     Status: None (Preliminary result)   Collection Time: 02/29/20  3:30 PM   Specimen: BLOOD LEFT WRIST  Result Value Ref Range Status   Specimen Description   Final    BLOOD LEFT WRIST BOTTLES DRAWN AEROBIC AND ANAEROBIC   Special Requests Blood Culture adequate volume  Final   Culture   Final    NO GROWTH 2 DAYS Performed at [facility], [address], [place], [place] [address]    Report Status PENDING  Incomplete  Blood culture (routine x 2)     Status: Abnormal   Collection Time: 02/29/20  3:31 PM   Specimen: BLOOD LEFT HAND  Result Value Ref Range Status   Specimen Description   Final    BLOOD LEFT HAND BOTTLES DRAWN AEROBIC AND ANAEROBIC Performed at [facility], [address] [place], [place] [address]    Special Requests   Final    Blood Culture adequate volume Performed at [facility], [address] [place], [place] [address]    Culture  Setup Time   Final    AEROBIC BOTTLE ONLY GRAM POSITIVE COCCI Gram Stain Report Called to,Read Back By and Verified With:  [facility] POINDEXTER 03/01/20 @1437  BY S. BEARD Organism ID to follow    Culture (A)  Final    STAPHYLOCOCCUS EPIDERMIDIS THE SIGNIFICANCE OF ISOLATING THIS ORGANISM FROM A SINGLE SET OF BLOOD CULTURES WHEN MULTIPLE SETS ARE DRAWN IS UNCERTAIN. PLEASE NOTIFY THE MICROBIOLOGY DEPARTMENT WITHIN ONE WEEK IF SPECIATION AND SENSITIVITIES ARE REQUIRED. Performed at [facility], [place] [address], [place], [place] [address]    Report Status 03/02/2020 FINAL  Final  Blood Culture ID Panel (Reflexed)     Status: Abnormal   Collection Time: 02/29/20  3:31 PM  Result Value Ref Range Status   Enterococcus faecalis NOT DETECTED NOT DETECTED Final   Enterococcus Faecium NOT DETECTED NOT DETECTED Final   Listeria monocytogenes NOT DETECTED NOT DETECTED Final    Staphylococcus species DETECTED (A) NOT DETECTED Final    Comment: CRITICAL RESULT CALLED TO, READ BACK BY AND VERIFIED WITH: P BROWN RN 2123 03/01/20 A BROWNING    Staphylococcus aureus (BCID) NOT DETECTED NOT DETECTED Final   Staphylococcus epidermidis DETECTED (A) NOT DETECTED Final    Comment: CRITICAL RESULT CALLED TO, READ BACK BY AND VERIFIED WITH: P BROWN RN 2123 03/01/20 A BROWNING    Staphylococcus lugdunensis NOT DETECTED NOT DETECTED Final   Streptococcus species NOT DETECTED NOT DETECTED Final   Streptococcus agalactiae NOT DETECTED NOT DETECTED Final   Streptococcus pneumoniae NOT DETECTED NOT DETECTED Final   Streptococcus pyogenes NOT DETECTED NOT DETECTED Final   A.calcoaceticus-baumannii NOT DETECTED NOT DETECTED Final   Bacteroides fragilis NOT DETECTED NOT DETECTED Final   Enterobacterales NOT DETECTED NOT DETECTED Final   Enterobacter cloacae complex NOT DETECTED NOT DETECTED Final   Escherichia coli NOT DETECTED NOT DETECTED Final   Klebsiella aerogenes NOT DETECTED NOT DETECTED Final   Klebsiella oxytoca NOT DETECTED NOT DETECTED Final   Klebsiella pneumoniae NOT DETECTED NOT DETECTED Final   Proteus species NOT DETECTED NOT DETECTED Final   Salmonella species NOT DETECTED NOT DETECTED Final   Serratia marcescens  NOT DETECTED NOT DETECTED Final   Haemophilus influenzae NOT DETECTED NOT DETECTED Final   Neisseria meningitidis NOT DETECTED NOT DETECTED Final   Pseudomonas aeruginosa NOT DETECTED NOT DETECTED Final   Stenotrophomonas maltophilia NOT DETECTED NOT DETECTED Final   Candida albicans NOT DETECTED NOT DETECTED Final   Candida auris NOT DETECTED NOT DETECTED Final   Candida glabrata NOT DETECTED NOT DETECTED Final   Candida krusei NOT DETECTED NOT DETECTED Final   Candida parapsilosis NOT DETECTED NOT DETECTED Final   Candida tropicalis NOT DETECTED NOT DETECTED Final   Cryptococcus neoformans/gattii NOT DETECTED NOT DETECTED Final   Methicillin  resistance mecA/C NOT DETECTED NOT DETECTED Final    Comment: Performed at [facility], [place] [address] [place], [place] [address]  Resp Panel by RT-PCR (Flu A&B, Covid) Nasopharyngeal Swab     Status: None   Collection Time: 02/29/20  3:42 PM   Specimen: Nasopharyngeal Swab; Nasopharyngeal(NP) swabs in vial transport medium  Result Value Ref Range Status   SARS Coronavirus 2 by RT PCR NEGATIVE NEGATIVE Final    Comment: (NOTE) SARS-CoV-2 target nucleic acids are NOT DETECTED.  The SARS-CoV-2 RNA is generally detectable in upper respiratory specimens during the acute phase of infection. The lowest concentration of SARS-CoV-2 viral copies this assay can detect is 138 copies/mL. A negative result does not preclude SARS-Cov-2 infection and should not be used as the sole basis for treatment or other patient management decisions. A negative result may occur with  improper specimen collection/handling, submission of specimen other than nasopharyngeal swab, presence of viral mutation(s) within the areas targeted by this assay, and inadequate number of viral copies(<138 copies/mL). A negative result must be combined with clinical observations, patient history, and epidemiological information. The expected result is Negative.  Fact Sheet for Patients:  EntrepreneurPulse.com.au  Fact Sheet for Healthcare Providers:  IncredibleEmployment.be  This test is no t yet approved or cleared by the [place] FDA and  has been authorized for detection and/or diagnosis of SARS-CoV-2 by FDA under an Emergency Use Authorization (EUA). This EUA will remain  in effect (meaning this test can be used) for the duration of the COVID-19 declaration under Section 564(b)(1) of the Act, 21 U.S.C.section 360bbb-3(b)(1), unless the authorization is terminated  or revoked sooner.       Influenza A by PCR NEGATIVE NEGATIVE Final   Influenza B by PCR NEGATIVE NEGATIVE  Final    Comment: (NOTE) The Xpert Xpress SARS-CoV-2/FLU/RSV plus assay is intended as an aid in the diagnosis of influenza from Nasopharyngeal swab specimens and should not be used as a sole basis for treatment. Nasal washings and aspirates are unacceptable for Xpert Xpress SARS-CoV-2/FLU/RSV testing.  Fact Sheet for Patients: EntrepreneurPulse.com.au  Fact Sheet for Healthcare Providers: IncredibleEmployment.be  This test is not yet approved or cleared by the [place] FDA and has been authorized for detection and/or diagnosis of SARS-CoV-2 by FDA under an Emergency Use Authorization (EUA). This EUA will remain in effect (meaning this test can be used) for the duration of the COVID-19 declaration under Section 564(b)(1) of the Act, 21 U.S.C. section 360bbb-3(b)(1), unless the authorization is terminated or revoked.  Performed at [facility], [address], [place],  [address]   Urine culture     Status: None   Collection Time: 02/29/20  4:44 PM   Specimen: Urine, Catheterized  Result Value Ref Range Status   Specimen Description   Final    URINE, CATHETERIZED Performed at [facility]  [facility], [address], [place], [place] [address]    Special Requests   Final    NONE Performed at [facility], [address], [place], [place] [address]    Culture   Final    NO GROWTH Performed at [facility], [place] [address], [place], [place] [address]    Report Status 03/01/2020 FINAL  Final  MRSA PCR Screening     Status: None   Collection Time: 02/29/20  7:47 PM   Specimen: Nasal Mucosa; Nasopharyngeal  Result Value Ref Range Status   MRSA by PCR NEGATIVE NEGATIVE Final    Comment:        The GeneXpert MRSA Assay (FDA approved for NASAL specimens only), is one component of a comprehensive MRSA colonization surveillance program. It is not intended to diagnose MRSA infection nor to guide or monitor treatment for MRSA  infections. Performed at [facility], [address] [place],  [address]      Time coordinating discharge: 35 minutes  SIGNED:   Rodena Goldmann, DO [facility] 03/02/2020, 11:11 AM  If 7PM-7AM, please contact night-coverage [email or web address]

## 2020-03-02 NOTE — TOC Transition Note (Signed)
Transition of Care [facility]) - CM/SW Discharge Note   Patient Details  Name: Amber Terry MRN: [id number] Date of Birth: 04-20-1935  Transition of Care [facility]) CM/SW Contact:  Boneta Lucks, RN Phone Number: 03/02/2020, 10:55 AM   Clinical Narrative:   Patient going home with [facility]. Hospice ordered a hospital bed yesterday. Waiting on Hospice to give a ETA on bed delivery. Patient will need EMS transport. TOC confirmed home address. EMS medical necessity printed.    Final next level of care: Home w Hospice Care Barriers to Discharge: Barriers Resolved   Patient Goals and CMS Choice Patient states their goals for this hospitalization and ongoing recovery are:: to go home.   Choice offered to / list presented to : Adult Children  Discharge Placement              Patient chooses bed at:  (Home) Patient to be transferred to facility by: EMS Name of family member notified: [name]- Daughter Patient and family notified of of transfer: 03/02/20  Discharge Plan and Services       [place] Date [place]: 03/01/20 Time [place]: 10 Representative spoke with at [place]: [place]   Readmission Risk Interventions Readmission Risk Prevention Plan 03/01/2020  Medication Screening Complete  Transportation Screening Complete  Some recent data might be hidden

## 2020-03-02 NOTE — Plan of Care (Signed)
  Problem: Education: Goal: Knowledge of General Education information will improve Description: Including pain rating scale, medication(s)/side effects and non-pharmacologic comfort measures Outcome: Not Applicable   Problem: Health Behavior/Discharge Planning: Goal: Ability to manage health-related needs will improve Outcome: Not Applicable   Problem: Clinical Measurements: Goal: Ability to maintain clinical measurements within normal limits will improve Outcome: Not Applicable Goal: Will remain free from infection Outcome: Not Applicable Goal: Diagnostic test results will improve Outcome: Not Applicable Goal: Respiratory complications will improve Outcome: Not Applicable Goal: Cardiovascular complication will be avoided Outcome: Not Applicable   Problem: Activity: Goal: Risk for activity intolerance will decrease Outcome: Not Applicable   Problem: Nutrition: Goal: Adequate nutrition will be maintained Outcome: Not Applicable   Problem: Coping: Goal: Level of anxiety will decrease Outcome: Not Applicable   Problem: Elimination: Goal: Will not experience complications related to bowel motility Outcome: Not Applicable Goal: Will not experience complications related to urinary retention Outcome: Not Applicable   Problem: Pain Managment: Goal: General experience of comfort will improve Outcome: Not Applicable   Problem: Safety: Goal: Ability to remain free from injury will improve Outcome: Not Applicable   Problem: Skin Integrity: Goal: Risk for impaired skin integrity will decrease Outcome: Not Applicable   Pt being discharged home under hospice care.

## 2020-03-02 NOTE — Progress Notes (Signed)
Palliative: Amber Terry is resting quietly in bed.  She wakes easily as I enter the room.  She will make an mostly keep eye contact.  She appears quite frail and elderly, obese.  She is hard of hearing, but is able to answer my questions.  She takes several sips of water without overt signs and symptoms of aspiration.  I believe that she is able to make her basic needs known.  There is no family at bedside at this time.  I asked about her pain.  Amber Terry tells me that her pain is improved.  She tells me that she is lonely.  I shared that she will go home today with her daughters.  She smiles and tells me that she has 5 daughters.  I encouraged her to ask for pain medicine as she needs it.  Conference with attending, bedside nursing staff, transition of care team related to patient condition, needs, goals of care.  Plan: Home with the benefits of hospice of [facility] today.  Transport via ambulance.  25 minutes  Quinn Axe, NP Palliative medicine team Team phone [phone]  Greater than 50% of this time was spent counseling and coordinating care related to the above assessment and plan.

## 2020-03-02 NOTE — Progress Notes (Signed)
Initial Nutrition Assessment  DOCUMENTATION CODES:   Obesity unspecified  INTERVENTION:  Glucerna Shake po BID, each supplement provides 220 kcal and 10 grams of protein  Juven BID, each packet provides 95 calories, 2.5 grams of protein (collagen), and 9.8 grams of carbohydrate (3 grams sugar); also contains 7 grams of L-arginine and L-glutamine, 300 mg vitamin C, 15 mg vitamin E, 1.2 mcg vitamin B-12, 9.5 mg zinc, 200 mg calcium, and 1.5 g  Calcium Beta-hydroxy-Beta-methylbutyrate to support wound healing  MVI with minerals daily  Mechanically altered dysphagia 3 diet for ease of intake  Unable to provide interventions at this time, orders have been reconciled for disharged   NUTRITION DIAGNOSIS:   Inadequate oral intake related to chronic illness as evidenced by  (po intake 50% insufficient to meet estimated needs).    GOAL:   Patient will meet greater than or equal to 90% of their needs   MONITOR:   PO intake,Supplement acceptance,Weight trends,Labs,Skin,I & O's  REASON FOR ASSESSMENT:   Consult Assessment of nutrition requirement/status  ASSESSMENT:  85 year old female with history significant of gout, arthritis, HTN, CKD stage IIIb, DM2, hypothyroidism, pubic rami fracture s/p recent fall (12/21), and stage II sacral ulcer presented with persistent weakness and poor appetite/intake over the past few days. PT admitted with hyponatremia.  Patient resting in bed this morning watching television. She is very hard of hearing, limited history obtained. Pt appetite and intake has been fair, eating 50% of the last 3 documented intakes. She reports the sausage was not good this morning and they forgot to give her syrup for the pancakes, but ate it anyway. Pt missing top four front teeth, will downgrade diet to dysphagia 3 (mechanical soft) for ease of intake and will order Glucerna Shakes to aid with meeting needs as well as Juven to support wound healing.   There is no recent  wt history for review.  I/Os: +937.8 ml since admit UOP: 900 ml x 24 hrs  Patient has been seen by palliative care, plans for discharge home today with hospice.   Medications reviewed and include: Ferrous sulfate, SSI, Protonix  Labs: CBGs 167,102,109,66, BUN 84 (H), Cr 2.01 (H), Mg 2.6 (H), Hgb 7.4 (L), HCT 23.6 (L) A1c 6.4 on 02/29/20   NUTRITION - FOCUSED PHYSICAL EXAM:  Flowsheet Row Most Recent Value  Orbital Region Moderate depletion  Upper Arm Region No depletion  Thoracic and Lumbar Region No depletion  Buccal Region Mild depletion  Temple Region Moderate depletion  Clavicle Bone Region No depletion  Clavicle and Acromion Bone Region No depletion  Scapular Bone Region Unable to assess  Patellar Region Moderate depletion  Anterior Thigh Region Unable to assess  Posterior Calf Region Unable to assess  Edema (RD Assessment) Moderate  Hair Reviewed  Eyes Reviewed  Mouth Reviewed  Skin Reviewed  Nails Reviewed       Diet Order:   Diet Order            Diet - low sodium heart healthy           Diet Carb Modified Fluid consistency: Thin; Room service appropriate? Yes  Diet effective now                 EDUCATION NEEDS:   No education needs have been identified at this time  Skin:  Skin Assessment: Skin Integrity Issues: Skin Integrity Issues:: Stage II Stage II: sacrum  Last BM:  pta  Height:   Ht Readings from Last  1 Encounters:  02/29/20 5\' 5"  (1.651 m)    Weight:   Wt Readings from Last 1 Encounters:  02/29/20 103.6 kg    BMI:  Body mass index is 38.01 kg/m.  Estimated Nutritional Needs:   Kcal:  2000-2200  Protein:  100-115  Fluid:  2 L   Lajuan Lines, RD, LDN Clinical Nutrition After Hours/Weekend Pager # in [place]

## 2020-03-03 LAB — CULTURE, BLOOD (ROUTINE X 2)

## 2020-03-06 LAB — CULTURE, BLOOD (ROUTINE X 2)
Culture: NO GROWTH
Special Requests: ADEQUATE

## 2021-01-02 DEATH — deceased

## 2021-08-01 IMAGING — CT CT ABD-PELV W/O CM
2 of 4 series · 17 of 46 positions shown, 19 images · non-contrast
Comparison: None.

CLINICAL DATA: Abdominal distension.

Pressure ulcers of the buttocks.
EXAM:
CT ABDOMEN AND PELVIS WITHOUT CONTRAST
TECHNIQUE: Multidetector CT imaging of the abdomen and pelvis was performed
following the standard protocol without IV contrast.

[Series 2: axial st · axial · 0.86mm/px · z∈[+705,+1110]mm · 14 of 93 slices shown, 16 images]
[im 6/93  soft-tissue]
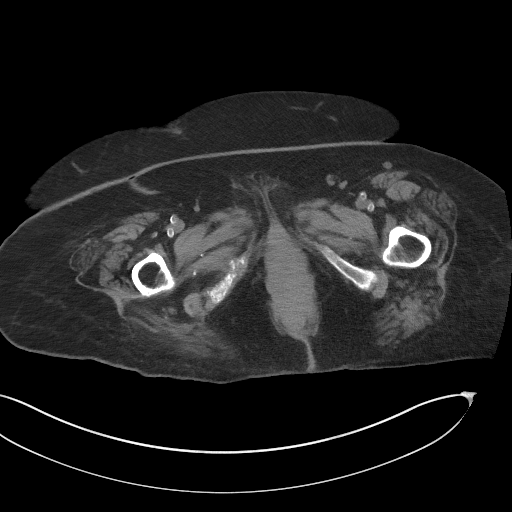
[im 6/93  bone]
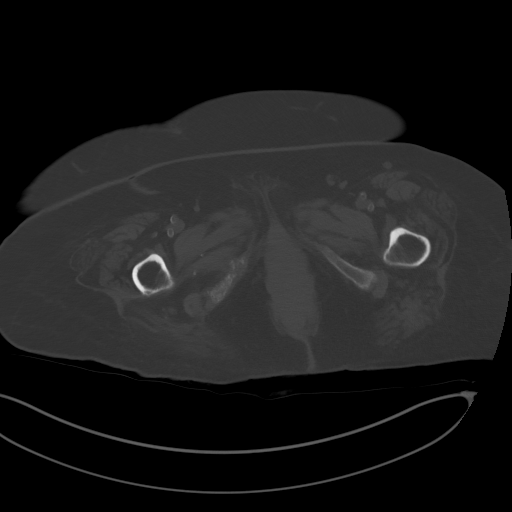
[im 11/93  soft-tissue]
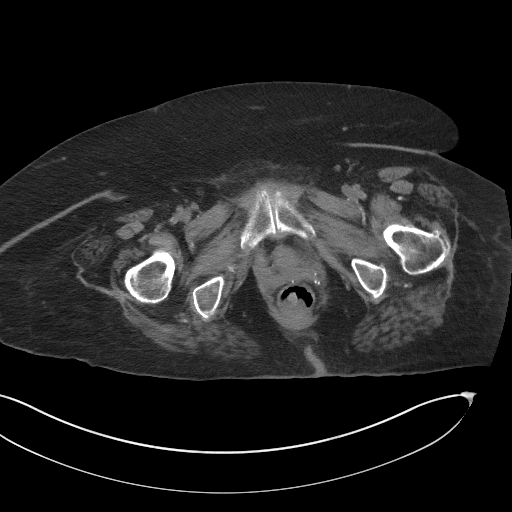
[im 17/93  soft-tissue]
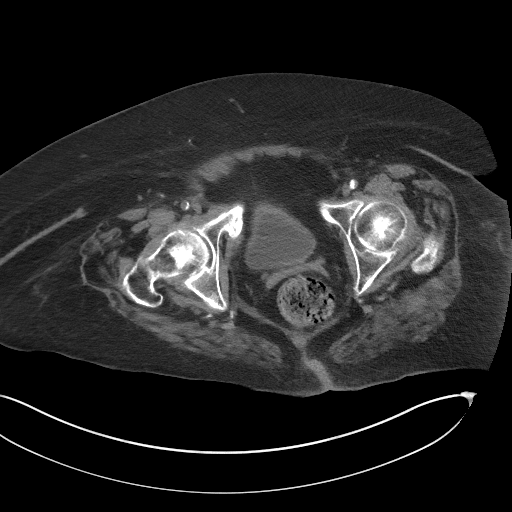
[im 28/93  soft-tissue]
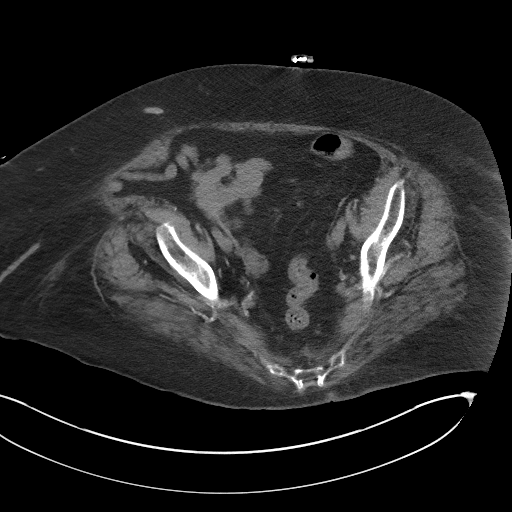
[im 33/93  soft-tissue]
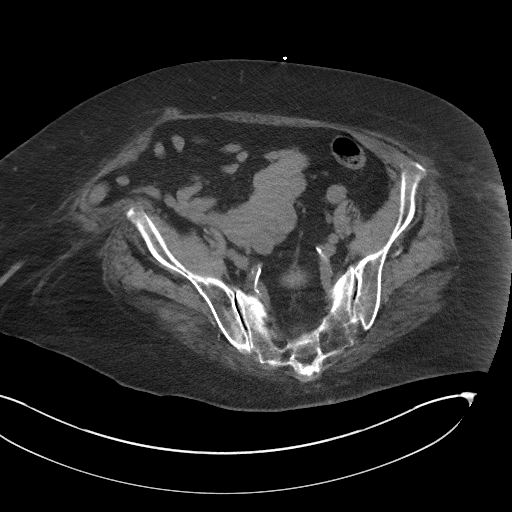
[im 38/93  soft-tissue]
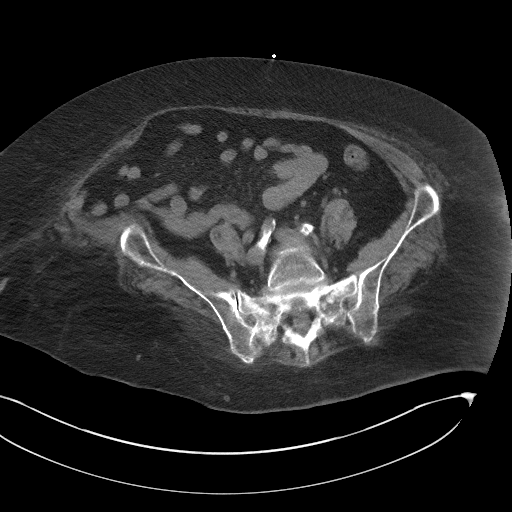
[im 44/93  soft-tissue]
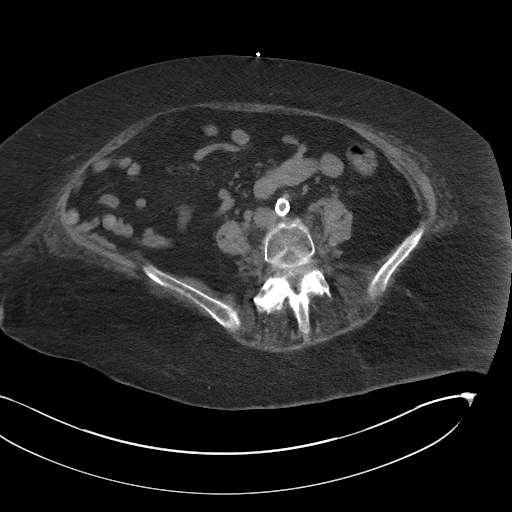
[im 49/93  soft-tissue]
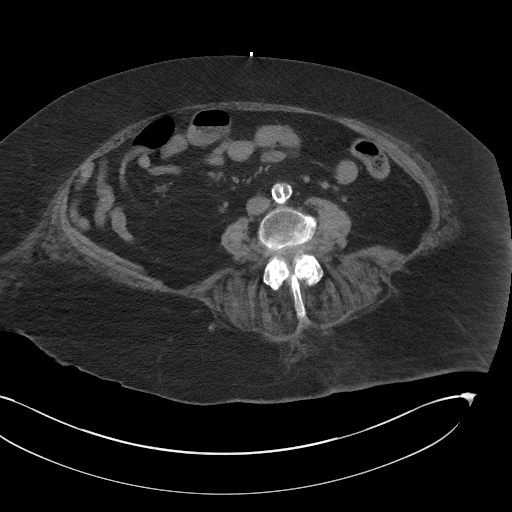
[im 55/93  soft-tissue]
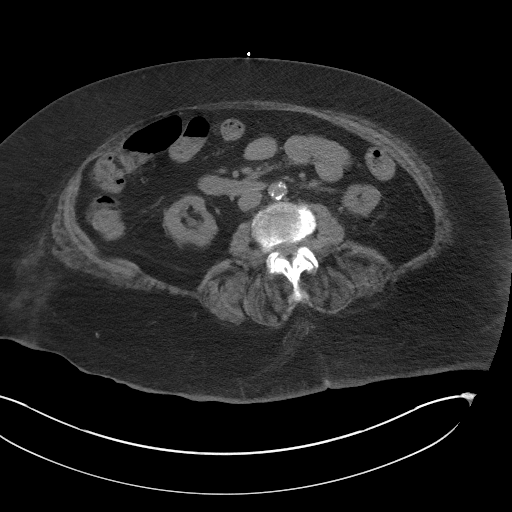
[im 55/93  bone]
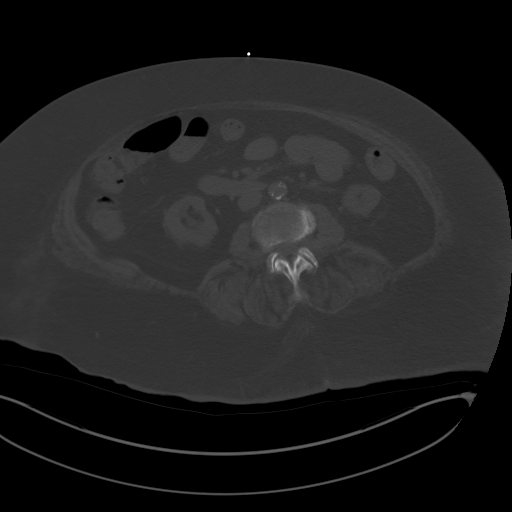
[im 60/93  soft-tissue]
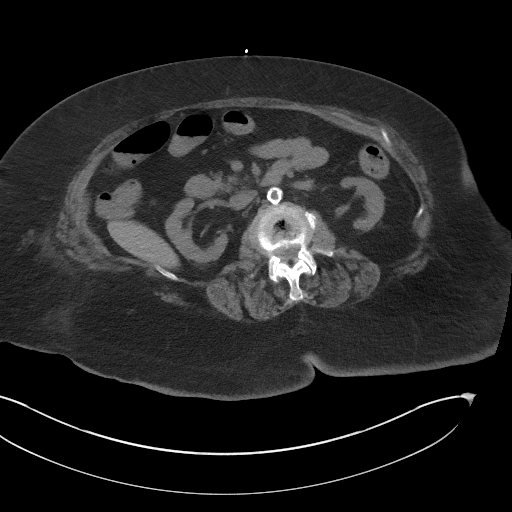
[im 71/93  soft-tissue]
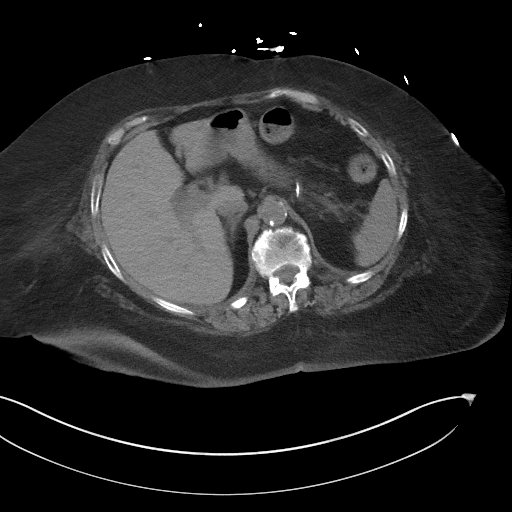
[im 76/93  soft-tissue]
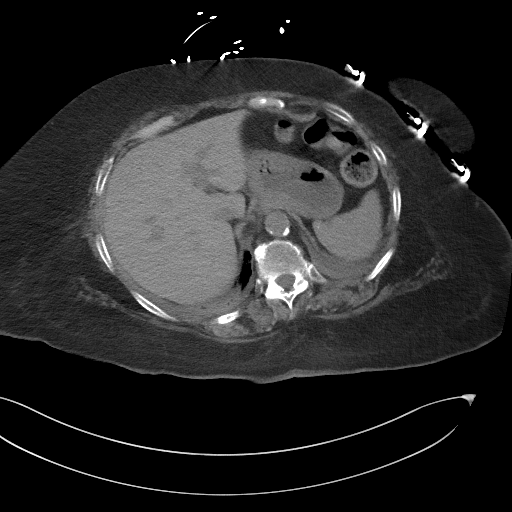
[im 82/93  soft-tissue]
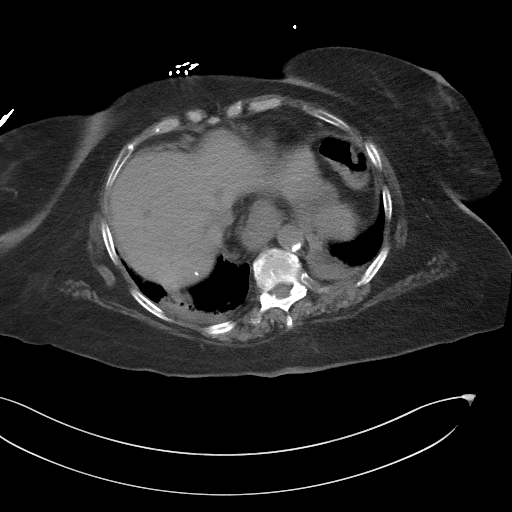
[im 87/93  soft-tissue]
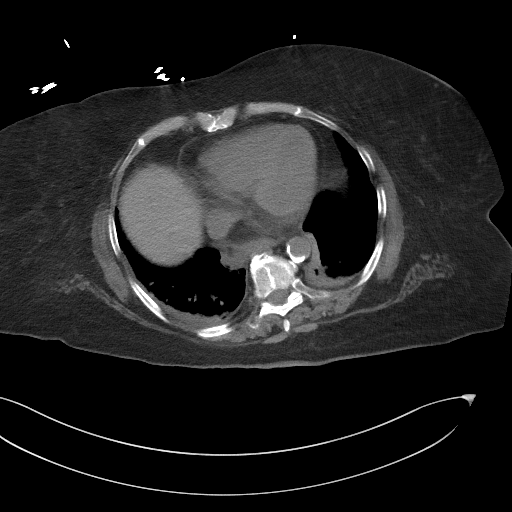

[Series 5: coronal st · coronal · 0.84mm/px · 3 of 106 slices shown]
[im 36/106  soft-tissue]
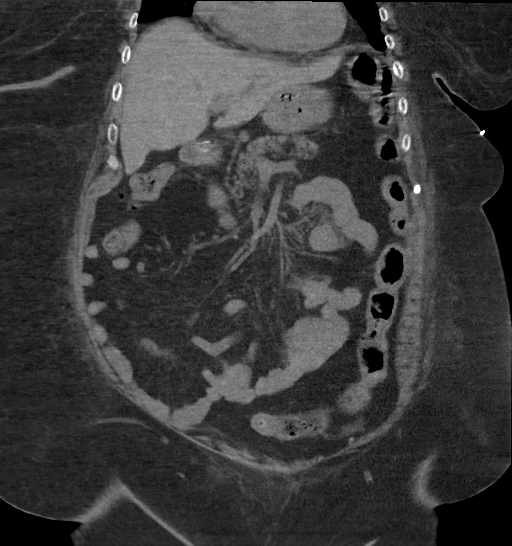
[im 47/106  soft-tissue]
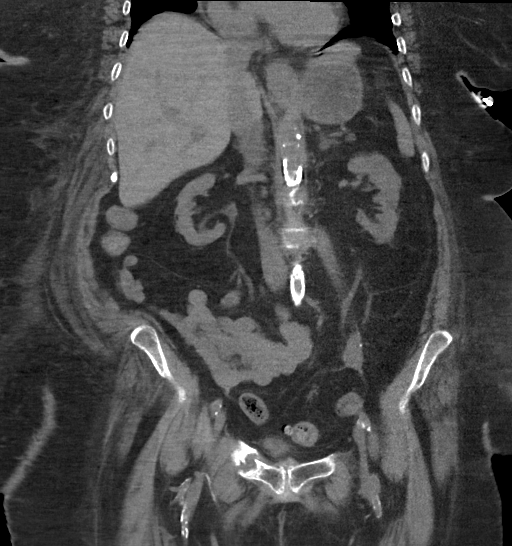
[im 59/106  soft-tissue]
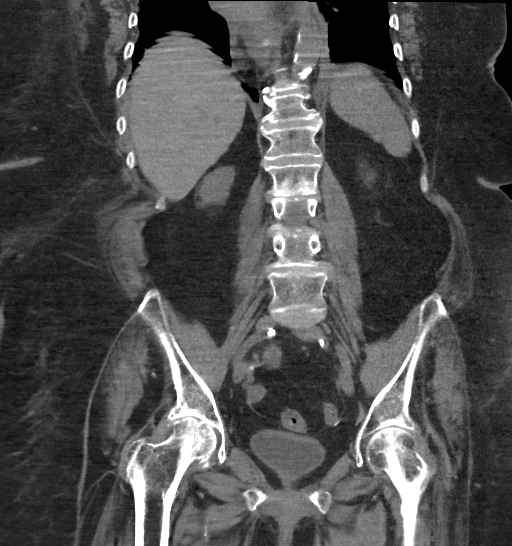

[17 of 46 positions shown; findings below may reference images not displayed]

FINDINGS: Lower chest: Bilateral lung bases airspace consolidation versus
atelectasis.

Hepatobiliary: No focal liver abnormality is seen. Status post
cholecystectomy. No biliary dilatation.

Pancreas: Unremarkable. No pancreatic ductal dilatation or
surrounding inflammatory changes.

Spleen: Normal in size without focal abnormality.

Adrenals/Urinary Tract: Normal adrenal glands. Atrophic kidneys. No
evidence of obstructive uropathy. Normal urinary bladder.

Stomach/Bowel: Stomach is within normal limits. No evidence of bowel
wall thickening, distention, or inflammatory changes.

Vascular/Lymphatic: Aortic atherosclerosis. No enlarged abdominal or
pelvic lymph nodes.

Reproductive: Status post hysterectomy. No adnexal masses.

Other: No abdominal wall hernia or abnormality. No abdominopelvic
ascites.

Musculoskeletal: Healing or healed fractures of the superior and
inferior right pubic rami. No acute abnormalities. No cortical
destructive changes in the sacrum.
IMPRESSION: 1. No evidence of acute abnormalities within the abdomen or pelvis.
2. Bilateral lung bases airspace consolidation versus atelectasis.
3. Atrophic kidneys.
4. Healing or healed fractures of the superior and inferior right
pubic rami.
5. Aortic atherosclerosis.

Aortic Atherosclerosis ([id number]).
